# Patient Record
Sex: Female | Born: 1955
Health system: Southern US, Community
[De-identification: ages and names within clinical notes are randomized; demographics above are authoritative.]

## PROBLEM LIST (undated history)

## (undated) DIAGNOSIS — E782 Mixed hyperlipidemia: Secondary | ICD-10-CM

## (undated) DIAGNOSIS — Z780 Asymptomatic menopausal state: Secondary | ICD-10-CM

## (undated) DIAGNOSIS — M549 Dorsalgia, unspecified: Secondary | ICD-10-CM

## (undated) DIAGNOSIS — R5382 Chronic fatigue, unspecified: Secondary | ICD-10-CM

## (undated) DIAGNOSIS — G43909 Migraine, unspecified, not intractable, without status migrainosus: Secondary | ICD-10-CM

## (undated) DIAGNOSIS — G629 Polyneuropathy, unspecified: Secondary | ICD-10-CM

## (undated) DIAGNOSIS — F419 Anxiety disorder, unspecified: Secondary | ICD-10-CM

## (undated) DIAGNOSIS — M797 Fibromyalgia: Secondary | ICD-10-CM

## (undated) DIAGNOSIS — K589 Irritable bowel syndrome without diarrhea: Secondary | ICD-10-CM

## (undated) DIAGNOSIS — R51 Headache: Secondary | ICD-10-CM

## (undated) DIAGNOSIS — R569 Unspecified convulsions: Secondary | ICD-10-CM

## (undated) DIAGNOSIS — R42 Dizziness and giddiness: Secondary | ICD-10-CM

## (undated) DIAGNOSIS — I872 Venous insufficiency (chronic) (peripheral): Secondary | ICD-10-CM

## (undated) DIAGNOSIS — G4700 Insomnia, unspecified: Secondary | ICD-10-CM

## (undated) HISTORY — PX: LAPAROSCOPY: SHX197

## (undated) HISTORY — PX: OVARY SURGERY: SHX727

## (undated) HISTORY — PX: HEMORRHOID SURGERY: SHX153

## (undated) HISTORY — DX: Fibromyalgia: M79.7

## (undated) HISTORY — DX: Headache: R51

## (undated) HISTORY — DX: Migraine, unspecified, not intractable, without status migrainosus: G43.909

## (undated) HISTORY — DX: Dizziness and giddiness: R42

## (undated) HISTORY — DX: Mixed hyperlipidemia: E78.2

## (undated) HISTORY — PX: APPENDECTOMY: SHX54

## (undated) HISTORY — DX: Dorsalgia, unspecified: M54.9

## (undated) HISTORY — PX: COLONOSCOPY: SHX174

## (undated) HISTORY — PX: TOTAL ABDOMINAL HYSTERECTOMY W/ BILATERAL SALPINGOOPHORECTOMY: SHX83

## (undated) HISTORY — DX: Unspecified convulsions: R56.9

## (undated) HISTORY — DX: Asymptomatic menopausal state: Z78.0

## (undated) HISTORY — DX: Chronic fatigue, unspecified: R53.82

## (undated) HISTORY — DX: Polyneuropathy, unspecified: G62.9

## (undated) HISTORY — PX: TONSILLECTOMY: SUR1361

## (undated) HISTORY — DX: Venous insufficiency (chronic) (peripheral): I87.2

## (undated) HISTORY — DX: Insomnia, unspecified: G47.00

## (undated) HISTORY — DX: Irritable bowel syndrome, unspecified: K58.9

## (undated) HISTORY — DX: Anxiety disorder, unspecified: F41.9

---

## 1998-06-14 ENCOUNTER — Other Ambulatory Visit: Admission: RE | Admit: 1998-06-14 | Discharge: 1998-06-14 | Payer: Self-pay | Admitting: Obstetrics & Gynecology

## 1999-10-13 ENCOUNTER — Other Ambulatory Visit: Admission: RE | Admit: 1999-10-13 | Discharge: 1999-10-13 | Payer: Self-pay | Admitting: Obstetrics & Gynecology

## 2001-02-13 ENCOUNTER — Ambulatory Visit (HOSPITAL_COMMUNITY): Admission: RE | Admit: 2001-02-13 | Discharge: 2001-02-13 | Payer: Self-pay | Admitting: Gastroenterology

## 2001-11-06 ENCOUNTER — Other Ambulatory Visit: Admission: RE | Admit: 2001-11-06 | Discharge: 2001-11-06 | Payer: Self-pay | Admitting: Obstetrics & Gynecology

## 2001-11-22 ENCOUNTER — Encounter: Payer: Self-pay | Admitting: Emergency Medicine

## 2001-11-22 ENCOUNTER — Inpatient Hospital Stay (HOSPITAL_COMMUNITY): Admission: EM | Admit: 2001-11-22 | Discharge: 2001-11-26 | Payer: Self-pay | Admitting: Emergency Medicine

## 2001-11-24 ENCOUNTER — Encounter: Payer: Self-pay | Admitting: Internal Medicine

## 2002-01-10 ENCOUNTER — Emergency Department (HOSPITAL_COMMUNITY): Admission: EM | Admit: 2002-01-10 | Discharge: 2002-01-10 | Payer: Self-pay | Admitting: Emergency Medicine

## 2002-01-10 ENCOUNTER — Encounter: Payer: Self-pay | Admitting: Emergency Medicine

## 2003-03-23 ENCOUNTER — Other Ambulatory Visit: Admission: RE | Admit: 2003-03-23 | Discharge: 2003-03-23 | Payer: Self-pay | Admitting: Obstetrics & Gynecology

## 2005-07-19 ENCOUNTER — Other Ambulatory Visit: Admission: RE | Admit: 2005-07-19 | Discharge: 2005-07-19 | Payer: Self-pay | Admitting: Obstetrics & Gynecology

## 2008-07-22 ENCOUNTER — Ambulatory Visit (HOSPITAL_COMMUNITY): Admission: RE | Admit: 2008-07-22 | Discharge: 2008-07-23 | Payer: Self-pay | Admitting: General Surgery

## 2008-07-22 ENCOUNTER — Encounter (HOSPITAL_BASED_OUTPATIENT_CLINIC_OR_DEPARTMENT_OTHER): Payer: Self-pay | Admitting: General Surgery

## 2010-12-27 NOTE — Op Note (Signed)
[name], [name]               ACCOUNT NO.:  [id number]   MEDICAL RECORD NO.:  [id number]          PATIENT TYPE:  AMB   LOCATION:  DAY                          FACILITY:  [facility]   PHYSICIAN:  Leonie Man, M.D.   DATE OF BIRTH:  05-29-1956   DATE OF PROCEDURE:  07/22/2008  DATE OF DISCHARGE:                               OPERATIVE REPORT   PREOPERATIVE DIAGNOSIS:  Grade 4 hemorrhoidal disease.   POSTOPERATIVE DIAGNOSIS:  Grade 4 hemorrhoidal disease.   PROCEDURE:  Full column hemorrhoidectomy.   SURGEON:  Leonie Man, M.D.   ASSISTANT:  OR tech.   ANESTHESIA:  General.   Ashlee Hooper is a 55 year old female with a longstanding  hemorrhoidal disease and with multiple medical problems, including  migraine headache and fibromyalgia.  She has been having significant  hemorrhoidal prolapse over the past several years and comes now for  hemorrhoidectomy because the hemorrhoids and prolapse make it difficult  for her to walk.  The patient is aware of the risks and the potential  benefits of surgery including the risks of pain, anal stricture and  sphincter damage.  She understands these risks and consents to surgery.   PROCEDURE:  Following the induction of satisfactory general endotracheal  anesthesia, the patient positioned prone jackknife position, buttock  cheeks are pulled apart with tapes and perianal tissues are prepped and  draped to be included in the sterile operative field.  The patient is  identified as Ashlee Hooper, procedure to be done hemorrhoidectomy.  All  surgical precautions were taken.  The patient is noted to be allergic to  penicillin, sulfa and codeine.  This was a difficult intubation.  However, the airway is well-secured at the time of surgery.  I then  infiltrated the perianal tissues with 1% lidocaine with epinephrine and  Wydase and then dilated the anal sphincters up to approximately three  fingerbreadths.  I inserted the operating scope  and visual exploration  showed two major areas of hemorrhoidal disease located at 2 o'clock and  8 o'clock positions with the patient in the prone position.  The  hemorrhoid at the 8 o'clock position was grasped and infiltrated with 1%  lidocaine with epinephrine.  An elliptical incision was then carried  from just below the dentate line over the surface of the hemorrhoid and  onto the perianal skin.  The hemorrhoid was then dissected free from off  the underlying external sphincter muscle, carrying the dissection up and  over the external and internal sphincter and removing the hemorrhoid in  its entirety.  The hemostasis was obtained with electrocautery and the  mucosa and mucocutaneous junction was reapproximated with a running  suture of 3-0 chromic catgut.  This area was thoroughly checked for  bleeding and noted to be dry.   Attention was then turned to the large hemorrhoidal complex at the 2  o'clock axis of the anus with the patient in the prone position.  This  was similarly injected with 1% lidocaine and another large elliptical  incision carried from below the dentate line across the mucocutaneous  junction onto the  buttock  skin.  The hemorrhoid was then dissected free  from off the external and internal sphincter muscles and carefully  carrying dissection up to remove the entire hemorrhoid.  Again  hemostasis was obtained with electrocautery.  The mucosa and  mucocutaneous junction was again closed with running suture of 3-0  chromic catgut.  Additional small hemorrhoids were then simply suture  ligated and/or cauterized.  All areas of dissection were then checked  for hemostasis, noted to be dry.  Sponge and  instrument counts were verified.  I used a Gelfoam pad soaked in  lidocaine to place over the incision lines.  A sterile dressing was  placed over the anal verge.  The anesthetic reversed and the patient was  removed from the operating room to the recovery room in  stable  condition.  She tolerated the procedure well.      Leonie Man, M.D.  Electronically Signed     PB/MEDQ  D:  07/22/2008  T:  07/22/2008  Job:  [id number]

## 2010-12-30 NOTE — Discharge Summary (Signed)
[place]. [facility]  Patient:    Ashlee Hooper, Ashlee Hooper Visit Number: [id number] MRN: [id number]          Service Type: MED Location: [phone] Attending Physician:  Alfonso Ramus Dictated by:   Hillery Aldo, M.D. Admit Date:  11/22/2001 Discharge Date: 11/26/2001   CC:         Delbert Harness, M.D., [place], [place].   Discharge Summary  PATIENT HOME ADDRESS: [address] [place], [place]. [address]  PATIENT PHONE NUMBER: [phone].  DISCHARGE DIAGNOSES: 1. Narcotics overdose. 2. Fibromyalgia. 3. Irritable bowel syndrome. 4. Migraine headaches. 5. Chronic fatigue syndrome. 6. History of obsessive compulsive disorder/depression. 7. Wilsons syndrome (reverse T3 excess). 8. Bradycardia. 9. Leukocytosis. 10.Elevated troponins.  DISCHARGE MEDICATIONS: 1. MS Contin 30 mg p.o. q.12h. 2. MSIR 15 mg p.o. q.6h. p.r.n. for breakthrough pain. 3. Estradiol transdermal 0.1 mg patch q.d. 4. Luvox as at home. 5. Imitrex as at home.  FOLLOW-UP: The patient will follow-up with her primary care physician, Dr. Biagio Quint, as previously scheduled. She will also follow-up with the pain clinic. to be arranged through Dr. Biagio Quint.  DISPOSITION: The patient is discharged home in stable condition. She is instructed to resume activity as tolerated. She is to maintain a lactose-free diet as tolerated. The patient was instructed not to take any additional narcotic pain medications or use the Fentanyl patches until she is seen by Dr. Biagio Quint.  PROCEDURES/DIAGNOSTIC STUDIES: 1. CT scan of the head without contrast on November 22, 2001. Negative for acute    process. 2. Chest x-ray on November 22, 2001. No evidence of active chest disease. 3. CT scan of the head without contrast on November 24, 2001. Negative    noncontrast cranial CT. No change from previous study. 4. A 2-D echocardiogram on November 25, 2001. Overall left ventricular systolic  function was normal. Left ventricular ejection fraction 55-65%. There were    no left ventricular regional wall motion abnormalities. Normal    echocardiographic study.  CONSULTATIONS: None.  HISTORY OF PRESENT ILLNESS: The patient is a 55 year old white female with a past medical history of fibromyalgia, irritable bowel syndrome, migraine headaches, and chronic fatigue syndrome, on multiple benzodiazepine and narcotic pain medications. She was found unresponsive by her husband approximately 2:30 pm the afternoon of her admission. The patient was recently begun on a new pain medication (Duragesic patch) the day prior and slept well through the night according to her husband. The patients husband noticed that she appeared pale and cyanotic when he found her at 2:30 pm and had difficulty waking her up so he called EMS. The patient was given 2 mg of Narcan by EMS and additional 1 mg while in the emergency department with profuse emesis afterward. According to the patients husband, the patient was instructed not to use any additional Morphine while on the Duragesic patch. Her prescription bottles were reviewed with the husband and it was noted that the patient was missing ten MSIR 15 mg tablets from the prescription that was filled the day prior, as well as two MS Contin 30 mg tablets.  The patient denied any suicide attempt or deliberately taking extra doses of pain medicine.  Review of systems was difficult to obtain secondary to the patients being drowsy. The patient did deny any recent cold, shortness of breath, cough, or chest pain except for her normal fibromyalgia type of pain. However, she did admit to a headache but has a known history of migraine headache pain. The  patient was admitted for further evaluation and workup.  ADMISSION LABORATORY DATA: White blood cell count of 21.2, hemoglobin 13.7, hematocrit 40.5, platelet count of 290. There were 90% neutrophils, 5% lymphocytes,  and 5% monocytes on the differential. Her chemistry showed a sodium of 140, potassium 4.6, chloride 105, bicarb 29, BUN 13, creatinine 1.6, glucose 77. Her acetaminophen level was 1.2, salicylate less than 5, and alcohol less than 5.  Urine pregnancy test was negative. Urine drug screen was positive for tetrahydrocannabinol, opiates, and benzodiazepines. Urinalysis revealed a specific gravity of 1.017 and was negative for glucose, hemoglobin, protein, ketones, nitrites, or leukocyte esterase.  DIAGNOSTIC STUDIES: An EKG was obtained which showed sinus tachycardia at 110 beats per minute but no ST segment changes and a normal access.  HOSPITAL COURSE:  #1 - ALTERED MENTAL STATUS: The patients presentation was felt to be consistent with opiate overdose. It became apparent that the patient had had a massage on the day prior to her presentation and might have had an excessive amount of Fentanyl released from the patch because she was lying on a heated pad.  She had improvement of mentation and arousal with Narcan. There was no other evidence of intracranial abnormality. Given the fact that the patient had an elevated white blood cell count and no obvious focus of infection, a lumbar puncture was performed. Cultures of the cerebral spinal fluid were negative times three days at the time of her discharge.  Additional findings included a protein level of 32, glucose slightly high at 89, The cell count included a WBC of 7 with 10% neutrophils, 65% lymphocytes, 25% macrophages, and 0 eosinophils. There were 82 red blood cells. VRDL was negative. AFB smear was negative.  Final culture results are pending.  The patients mentation improved throughout the course of her hospitalization and at the time of her discharge, she is alert and oriented. Given the laboratory findings, it was felt that the patients altered mental status was the result of a narcotic overdosage alone.  She was started on MS  Contin 30 mg q.12h. the day after her  admission to avoid any narcotics withdrawal problems.  #2 - ELEVATED WHITE BLOOD CELL COUNT: The patients urinalysis, blood cultures, chest x-ray, and lumbar puncture did not reveal any source of infectious etiology. At the time of her discharge, the patients white blood cell count has normalized and is 9.4.  Her elevated white blood cell count might have been secondary to demargination secondary to stress.  #3 - BRADYCARDIA: The patient developed a bradycardic rhythm on April 13,2003, prompting an EKG which revealed T wave inversion in the inferolateral leads that were new since her admission.  Her cardiac enzymes were checked times three and she had some abnormalities including a CK of 759, 570, and 444. A CKMB of 2.3, 2.2, and 1.8. And a troponin I of 0.32. 0.30, and 0.24. This prompted further cardiac evaluation with a 2-D echocardiogram which did not show any evidence of myocardial dysfunction. It is unclear as to the exact etiology of her elevated CKs and troponins, although when she presented, she was hypoxic and it is possible that her impending respiratory failure may have contributed. Additionally, further testing was undertaken to evaluate the source of her bradycardia. A TSH was found to be normal at 1.397, free T4 level was slightly low at 0.59, and T3 was normal at 62.6. She did not have any evidence of Lyme disease or other autoimmune process as her ANA was negative, anti-DNA antibodies  were negative, her rheumatoid factor was less than 20, Lyme disease titers were negative.  #4 - HYPOXIA: The patient was hypoxic on initial arrival with pulse oximetry reading of 80% out in the field. The patients O2 saturations improved dramatically with supplemental oxygen and reversal of narcotic induced respiratory depression with Narcan. The patient did not require intubation or any other Narcan throughout the course of her hospitalization. She  maintained her O2 saturations in the upper 90s.  #5 - IRRITABLE BOWEL SYNDROME: The patient did have some difficulties with moving her bowels normally while hospitalized. She had some problems with constipation and pain. She was given Sorbitol as well as laxative of choice p.r.n. The Sorbitol was effective. However, she did have some diarrhea on the night prior to her discharge. She was supplemented with some potassium prior to discharge secondary to this.  #6 - FIBROMYALGIA: The patient had fibromyalgia type point tenderness. Given her recent narcotics overdose, a low level of long acting pain medication was provided with supplemental nonsteroidal anti-inflammatories. The patient will follow-up with regard to this problem with her primary care physician and be enrolled in a pain clinic for further management. Dictated by:   Hillery Aldo, M.D. Attending Physician:  Alfonso Ramus DD:  11/26/01 TD:  11/26/01 Job: [id number]

## 2011-05-19 LAB — DIFFERENTIAL
Eosinophils Absolute: 0.1 10*3/uL (ref 0.0–0.7)
Eosinophils Relative: 1 % (ref 0–5)
Lymphocytes Relative: 17 % (ref 12–46)
Lymphs Abs: 1.6 10*3/uL (ref 0.7–4.0)
Monocytes Absolute: 0.5 10*3/uL (ref 0.1–1.0)

## 2011-05-19 LAB — COMPREHENSIVE METABOLIC PANEL
ALT: 20 U/L (ref 0–35)
AST: 22 U/L (ref 0–37)
Albumin: 3.5 g/dL (ref 3.5–5.2)
CO2: 30 mEq/L (ref 19–32)
Calcium: 9.2 mg/dL (ref 8.4–10.5)
Chloride: 101 mEq/L (ref 96–112)
Creatinine, Ser: 0.94 mg/dL (ref 0.4–1.2)
GFR calc Af Amer: >60 mL/min (ref >60)
GFR calc non Af Amer: >60 mL/min (ref >60)
Sodium: 140 mEq/L (ref 135–145)
Total Bilirubin: 0.4 mg/dL (ref 0.3–1.2)

## 2011-05-19 LAB — TYPE AND SCREEN
ABO/RH(D): A POS
Antibody Screen: NEGATIVE

## 2011-05-19 LAB — CBC
HCT: 43 % (ref 36.0–46.0)
Hemoglobin: 14.3 g/dL (ref 12.0–15.0)
MCHC: 33.3 g/dL (ref 30.0–36.0)
MCV: 98.2 fL (ref 78.0–100.0)
Platelets: 324 10*3/uL (ref 150–400)
RBC: 4.38 MIL/uL (ref 3.87–5.11)
RDW: 15.9 % — ABNORMAL HIGH (ref 11.5–15.5)
WBC: 9.2 10*3/uL (ref 4.0–10.5)

## 2011-05-19 LAB — PROTIME-INR: Prothrombin Time: 13.1 seconds (ref 11.6–15.2)

## 2011-05-19 LAB — ABO/RH: ABO/RH(D): A POS

## 2011-10-09 DIAGNOSIS — M159 Polyosteoarthritis, unspecified: Secondary | ICD-10-CM | POA: Diagnosis not present

## 2011-10-09 DIAGNOSIS — M549 Dorsalgia, unspecified: Secondary | ICD-10-CM | POA: Diagnosis not present

## 2011-10-09 DIAGNOSIS — IMO0001 Reserved for inherently not codable concepts without codable children: Secondary | ICD-10-CM | POA: Diagnosis not present

## 2011-10-09 DIAGNOSIS — E782 Mixed hyperlipidemia: Secondary | ICD-10-CM | POA: Diagnosis not present

## 2011-12-25 DIAGNOSIS — M549 Dorsalgia, unspecified: Secondary | ICD-10-CM | POA: Diagnosis not present

## 2011-12-25 DIAGNOSIS — E782 Mixed hyperlipidemia: Secondary | ICD-10-CM | POA: Diagnosis not present

## 2011-12-25 DIAGNOSIS — M159 Polyosteoarthritis, unspecified: Secondary | ICD-10-CM | POA: Diagnosis not present

## 2011-12-25 DIAGNOSIS — IMO0001 Reserved for inherently not codable concepts without codable children: Secondary | ICD-10-CM | POA: Diagnosis not present

## 2012-01-01 DIAGNOSIS — R7989 Other specified abnormal findings of blood chemistry: Secondary | ICD-10-CM | POA: Diagnosis not present

## 2012-01-01 DIAGNOSIS — H908 Mixed conductive and sensorineural hearing loss, unspecified: Secondary | ICD-10-CM | POA: Diagnosis not present

## 2012-01-01 DIAGNOSIS — M549 Dorsalgia, unspecified: Secondary | ICD-10-CM | POA: Diagnosis not present

## 2012-01-01 DIAGNOSIS — E782 Mixed hyperlipidemia: Secondary | ICD-10-CM | POA: Diagnosis not present

## 2012-03-25 DIAGNOSIS — G609 Hereditary and idiopathic neuropathy, unspecified: Secondary | ICD-10-CM | POA: Diagnosis not present

## 2012-03-25 DIAGNOSIS — E782 Mixed hyperlipidemia: Secondary | ICD-10-CM | POA: Diagnosis not present

## 2012-03-25 DIAGNOSIS — M549 Dorsalgia, unspecified: Secondary | ICD-10-CM | POA: Diagnosis not present

## 2012-03-25 DIAGNOSIS — M159 Polyosteoarthritis, unspecified: Secondary | ICD-10-CM | POA: Diagnosis not present

## 2012-04-01 DIAGNOSIS — IMO0001 Reserved for inherently not codable concepts without codable children: Secondary | ICD-10-CM | POA: Diagnosis not present

## 2012-04-01 DIAGNOSIS — E782 Mixed hyperlipidemia: Secondary | ICD-10-CM | POA: Diagnosis not present

## 2012-04-01 DIAGNOSIS — R7989 Other specified abnormal findings of blood chemistry: Secondary | ICD-10-CM | POA: Diagnosis not present

## 2012-04-01 DIAGNOSIS — M549 Dorsalgia, unspecified: Secondary | ICD-10-CM | POA: Diagnosis not present

## 2012-07-02 DIAGNOSIS — G609 Hereditary and idiopathic neuropathy, unspecified: Secondary | ICD-10-CM | POA: Diagnosis not present

## 2012-07-02 DIAGNOSIS — E782 Mixed hyperlipidemia: Secondary | ICD-10-CM | POA: Diagnosis not present

## 2012-07-02 DIAGNOSIS — M549 Dorsalgia, unspecified: Secondary | ICD-10-CM | POA: Diagnosis not present

## 2012-07-02 DIAGNOSIS — IMO0001 Reserved for inherently not codable concepts without codable children: Secondary | ICD-10-CM | POA: Diagnosis not present

## 2012-08-26 DIAGNOSIS — Z1231 Encounter for screening mammogram for malignant neoplasm of breast: Secondary | ICD-10-CM | POA: Diagnosis not present

## 2012-08-26 DIAGNOSIS — Z124 Encounter for screening for malignant neoplasm of cervix: Secondary | ICD-10-CM | POA: Diagnosis not present

## 2012-08-26 DIAGNOSIS — Z1212 Encounter for screening for malignant neoplasm of rectum: Secondary | ICD-10-CM | POA: Diagnosis not present

## 2012-08-30 ENCOUNTER — Other Ambulatory Visit: Payer: Self-pay | Admitting: Obstetrics & Gynecology

## 2012-08-30 DIAGNOSIS — R928 Other abnormal and inconclusive findings on diagnostic imaging of breast: Secondary | ICD-10-CM

## 2012-09-24 ENCOUNTER — Ambulatory Visit
Admission: RE | Admit: 2012-09-24 | Discharge: 2012-09-24 | Disposition: A | Discharge status: Home / Self Care | Payer: Medicare Other | Source: Ambulatory Visit | Attending: Obstetrics & Gynecology | Admitting: Obstetrics & Gynecology

## 2012-09-24 DIAGNOSIS — R928 Other abnormal and inconclusive findings on diagnostic imaging of breast: Secondary | ICD-10-CM

## 2012-10-02 DIAGNOSIS — E782 Mixed hyperlipidemia: Secondary | ICD-10-CM | POA: Diagnosis not present

## 2012-10-02 DIAGNOSIS — G609 Hereditary and idiopathic neuropathy, unspecified: Secondary | ICD-10-CM | POA: Diagnosis not present

## 2013-01-01 DIAGNOSIS — Z1331 Encounter for screening for depression: Secondary | ICD-10-CM | POA: Diagnosis not present

## 2013-01-01 DIAGNOSIS — M549 Dorsalgia, unspecified: Secondary | ICD-10-CM | POA: Diagnosis not present

## 2013-01-01 DIAGNOSIS — E782 Mixed hyperlipidemia: Secondary | ICD-10-CM | POA: Diagnosis not present

## 2013-01-01 DIAGNOSIS — G609 Hereditary and idiopathic neuropathy, unspecified: Secondary | ICD-10-CM | POA: Diagnosis not present

## 2013-01-01 DIAGNOSIS — Z0000 Encounter for general adult medical examination without abnormal findings: Secondary | ICD-10-CM | POA: Diagnosis not present

## 2013-01-08 DIAGNOSIS — H908 Mixed conductive and sensorineural hearing loss, unspecified: Secondary | ICD-10-CM | POA: Diagnosis not present

## 2013-01-08 DIAGNOSIS — M549 Dorsalgia, unspecified: Secondary | ICD-10-CM | POA: Diagnosis not present

## 2013-01-08 DIAGNOSIS — IMO0001 Reserved for inherently not codable concepts without codable children: Secondary | ICD-10-CM | POA: Diagnosis not present

## 2013-01-08 DIAGNOSIS — E782 Mixed hyperlipidemia: Secondary | ICD-10-CM | POA: Diagnosis not present

## 2013-01-08 DIAGNOSIS — M159 Polyosteoarthritis, unspecified: Secondary | ICD-10-CM | POA: Diagnosis not present

## 2013-03-18 DIAGNOSIS — F332 Major depressive disorder, recurrent severe without psychotic features: Secondary | ICD-10-CM | POA: Diagnosis not present

## 2013-03-28 DIAGNOSIS — F332 Major depressive disorder, recurrent severe without psychotic features: Secondary | ICD-10-CM | POA: Diagnosis not present

## 2013-04-04 DIAGNOSIS — F332 Major depressive disorder, recurrent severe without psychotic features: Secondary | ICD-10-CM | POA: Diagnosis not present

## 2013-04-08 DIAGNOSIS — F332 Major depressive disorder, recurrent severe without psychotic features: Secondary | ICD-10-CM | POA: Diagnosis not present

## 2013-04-10 DIAGNOSIS — M159 Polyosteoarthritis, unspecified: Secondary | ICD-10-CM | POA: Diagnosis not present

## 2013-04-10 DIAGNOSIS — M549 Dorsalgia, unspecified: Secondary | ICD-10-CM | POA: Diagnosis not present

## 2013-04-10 DIAGNOSIS — E782 Mixed hyperlipidemia: Secondary | ICD-10-CM | POA: Diagnosis not present

## 2013-06-04 DIAGNOSIS — F330 Major depressive disorder, recurrent, mild: Secondary | ICD-10-CM | POA: Diagnosis not present

## 2013-07-09 DIAGNOSIS — G43909 Migraine, unspecified, not intractable, without status migrainosus: Secondary | ICD-10-CM | POA: Diagnosis not present

## 2013-07-09 DIAGNOSIS — M549 Dorsalgia, unspecified: Secondary | ICD-10-CM | POA: Diagnosis not present

## 2013-07-09 DIAGNOSIS — IMO0001 Reserved for inherently not codable concepts without codable children: Secondary | ICD-10-CM | POA: Diagnosis not present

## 2013-07-09 DIAGNOSIS — M159 Polyosteoarthritis, unspecified: Secondary | ICD-10-CM | POA: Diagnosis not present

## 2013-10-16 DIAGNOSIS — E782 Mixed hyperlipidemia: Secondary | ICD-10-CM | POA: Diagnosis not present

## 2013-10-16 DIAGNOSIS — M159 Polyosteoarthritis, unspecified: Secondary | ICD-10-CM | POA: Diagnosis not present

## 2013-10-16 DIAGNOSIS — M549 Dorsalgia, unspecified: Secondary | ICD-10-CM | POA: Diagnosis not present

## 2013-10-28 DIAGNOSIS — F330 Major depressive disorder, recurrent, mild: Secondary | ICD-10-CM | POA: Diagnosis not present

## 2013-11-18 DIAGNOSIS — Z01419 Encounter for gynecological examination (general) (routine) without abnormal findings: Secondary | ICD-10-CM | POA: Diagnosis not present

## 2013-12-03 DIAGNOSIS — H25019 Cortical age-related cataract, unspecified eye: Secondary | ICD-10-CM | POA: Diagnosis not present

## 2013-12-03 DIAGNOSIS — H5210 Myopia, unspecified eye: Secondary | ICD-10-CM | POA: Diagnosis not present

## 2013-12-03 DIAGNOSIS — H524 Presbyopia: Secondary | ICD-10-CM | POA: Diagnosis not present

## 2014-01-22 DIAGNOSIS — E782 Mixed hyperlipidemia: Secondary | ICD-10-CM | POA: Diagnosis not present

## 2014-01-22 DIAGNOSIS — R7989 Other specified abnormal findings of blood chemistry: Secondary | ICD-10-CM | POA: Diagnosis not present

## 2014-01-22 DIAGNOSIS — Z0000 Encounter for general adult medical examination without abnormal findings: Secondary | ICD-10-CM | POA: Diagnosis not present

## 2014-01-22 DIAGNOSIS — IMO0001 Reserved for inherently not codable concepts without codable children: Secondary | ICD-10-CM | POA: Diagnosis not present

## 2014-01-22 DIAGNOSIS — Z1331 Encounter for screening for depression: Secondary | ICD-10-CM | POA: Diagnosis not present

## 2014-01-29 DIAGNOSIS — IMO0001 Reserved for inherently not codable concepts without codable children: Secondary | ICD-10-CM | POA: Diagnosis not present

## 2014-01-29 DIAGNOSIS — M549 Dorsalgia, unspecified: Secondary | ICD-10-CM | POA: Diagnosis not present

## 2014-01-29 DIAGNOSIS — M159 Polyosteoarthritis, unspecified: Secondary | ICD-10-CM | POA: Diagnosis not present

## 2014-01-29 DIAGNOSIS — E782 Mixed hyperlipidemia: Secondary | ICD-10-CM | POA: Diagnosis not present

## 2014-03-05 DIAGNOSIS — R42 Dizziness and giddiness: Secondary | ICD-10-CM | POA: Diagnosis not present

## 2014-03-25 DIAGNOSIS — R42 Dizziness and giddiness: Secondary | ICD-10-CM | POA: Diagnosis not present

## 2014-03-25 DIAGNOSIS — R55 Syncope and collapse: Secondary | ICD-10-CM | POA: Diagnosis not present

## 2014-03-30 ENCOUNTER — Other Ambulatory Visit (HOSPITAL_COMMUNITY): Payer: Self-pay | Admitting: Internal Medicine

## 2014-03-30 DIAGNOSIS — R55 Syncope and collapse: Secondary | ICD-10-CM

## 2014-04-02 ENCOUNTER — Ambulatory Visit (HOSPITAL_COMMUNITY)
Admission: RE | Admit: 2014-04-02 | Discharge: 2014-04-02 | Disposition: A | Discharge status: Home / Self Care | Payer: Medicare Other | Source: Ambulatory Visit | Attending: Cardiology | Admitting: Cardiology

## 2014-04-02 ENCOUNTER — Ambulatory Visit (HOSPITAL_COMMUNITY)
Admission: RE | Admit: 2014-04-02 | Discharge: 2014-04-02 | Disposition: A | Discharge status: Home / Self Care | Payer: Medicare Other | Source: Ambulatory Visit | Attending: Internal Medicine | Admitting: Internal Medicine

## 2014-04-02 DIAGNOSIS — R55 Syncope and collapse: Secondary | ICD-10-CM

## 2014-04-02 NOTE — Progress Notes (Signed)
2D Echo Performed 04/02/2014    Marygrace Drought, RCS

## 2014-04-02 NOTE — Progress Notes (Signed)
Carotid Duplex Completed. Mackinzie Vuncannon, BS, RDMS, RVT  

## 2014-04-13 DIAGNOSIS — IMO0001 Reserved for inherently not codable concepts without codable children: Secondary | ICD-10-CM | POA: Diagnosis not present

## 2014-04-13 DIAGNOSIS — R42 Dizziness and giddiness: Secondary | ICD-10-CM | POA: Diagnosis not present

## 2014-04-13 DIAGNOSIS — M159 Polyosteoarthritis, unspecified: Secondary | ICD-10-CM | POA: Diagnosis not present

## 2014-04-21 DIAGNOSIS — F331 Major depressive disorder, recurrent, moderate: Secondary | ICD-10-CM | POA: Diagnosis not present

## 2014-04-27 ENCOUNTER — Encounter: Payer: Self-pay | Admitting: Neurology

## 2014-04-28 ENCOUNTER — Encounter: Payer: Self-pay | Admitting: Neurology

## 2014-04-28 ENCOUNTER — Encounter (INDEPENDENT_AMBULATORY_CARE_PROVIDER_SITE_OTHER): Payer: Self-pay

## 2014-04-28 ENCOUNTER — Ambulatory Visit (INDEPENDENT_AMBULATORY_CARE_PROVIDER_SITE_OTHER): Payer: Medicare Other | Admitting: Neurology

## 2014-04-28 VITALS — BP 110/70 | HR 74 | Ht 64.0 in | Wt 150.0 lb

## 2014-04-28 DIAGNOSIS — R519 Headache, unspecified: Secondary | ICD-10-CM | POA: Insufficient documentation

## 2014-04-28 DIAGNOSIS — R42 Dizziness and giddiness: Secondary | ICD-10-CM

## 2014-04-28 DIAGNOSIS — R51 Headache: Secondary | ICD-10-CM | POA: Diagnosis not present

## 2014-04-28 HISTORY — DX: Headache: R51

## 2014-04-28 HISTORY — DX: Dizziness and giddiness: R42

## 2014-04-28 MED ORDER — TIZANIDINE HCL 2 MG PO TABS: 2.0000 mg | ORAL_TABLET | Freq: Three times a day (TID) | ORAL | Status: DC

## 2014-04-28 NOTE — Progress Notes (Signed)
Reason for visit: Dizziness   Ashlee Hooper is a 58 y.o. female  History of present illness:  Ashlee Hooper is a 58 year old right-handed white female with a history of underlying depression, fibromyalgia, and chronic fatigue syndrome. The patient reports a history of migraine headache as well. She indicates that at the end of December 2014, she began to have some onset of dizziness associated with true spinning feeling, but as time has gone on, the patient reports a more rocking sensation. The patient also has a pressure sensation in the head, and her symptoms are better in the morning, worse as the day goes on. The patient reports some occasional neck stiffness and discomfort. The patient has transient episodes of numbness that affect the lips, roof of the mouth, and across the forehead and nose. This may come and go. The patient may have some nausea, occasional vomiting, and she will have some blurring of vision. The patient does report some gait instability, no falls, and no difficulty controlling the bowels or the bladder. The patient has been placed on Pristiq, but she was placed on the medication in September 2014, well before her symptoms began. She does have occasional episodes of syncope, and she indicates that this has been a problem for her throughout her life. She reports that she did have episodes of vertigo when she was in her 30s and 20s for brief periods of time, but never for several months in a row. The patient is sent to this office for an evaluation. She indicates that she went through vestibular rehabilitation, and she gained no benefit from this. She is not sleeping well at night even with 60 mg of Restoril at night.  Past Medical History  Diagnosis Date  . Mixed hyperlipidemia   . Fibromyalgia   . Peripheral neuropathy   . Migraine   . Backache, unspecified   . Venous insufficiency   . Insomnia   . Menopause   . Vertigo   . Chronic fatigue   . Dizziness and  giddiness 04/28/2014  . Headache(784.0) 04/28/2014    Past Surgical History  Procedure Laterality Date  . Tonsillectomy    . Laparoscopy    . Colonoscopy    . Total abdominal hysterectomy w/ bilateral salpingoophorectomy    . Hemorrhoid surgery      Family History  Problem Relation Age of Onset  . Cirrhosis Father   . Alcohol abuse Father   . Fibromyalgia Sister     Social history:  reports that she has quit smoking. Her smoking use included Cigarettes. She has a 30 pack-year smoking history. She has never used smokeless tobacco. She reports that she does not drink alcohol or use illicit drugs.  Medications:  Current Outpatient Prescriptions on File Prior to Visit  Medication Sig Dispense Refill  . Cholecalciferol (VITAMIN D3) 2000 UNITS TABS Take 1 tablet by mouth daily.      Marland Kitchen estradiol (VIVELLE-DOT) 0.1 MG/24HR patch Place 1 patch onto the skin 2 (two) times a week.      . Omega-3 Fatty Acids (FISH OIL) 1000 MG CAPS Take 2 capsules by mouth daily.      . SUMAtriptan (IMITREX) 20 MG/ACT nasal spray Place 20 mg into the nose every 2 (two) hours as needed for migraine or headache. May repeat in 2 hours if headache persists or recurs.      . triamterene-hydrochlorothiazide (MAXZIDE-25) 37.5-25 MG per tablet Take 1 tablet by mouth daily.      . valACYclovir (  VALTREX) 1000 MG tablet Take 1,000 mg by mouth 2 (two) times daily.      . vitamin C (ASCORBIC ACID) 500 MG tablet Take 500 mg by mouth daily.      . vitamin E 400 UNIT capsule Take 400 Units by mouth daily.       No current facility-administered medications on file prior to visit.      Allergies  Allergen Reactions  . Bactrim [Sulfamethoxazole-Tmp Ds]   . Codeine Sulfate   . Penicillins   . Wellbutrin [Bupropion] Other (See Comments)  . Zoloft [Sertraline Hcl] Other (See Comments)    ROS:  Out of a complete 14 system review of symptoms, the patient complains only of the following symptoms, and all other reviewed  systems are negative.  Fatigue Vertigo Blurred vision Jaw pain, achy muscles Skin sensitivity Confusion, headache, numbness, dizziness, syncope Depression, anxiety, insomnia, decreased energy Sleepiness  Blood pressure 110/70, pulse 74, height 5\' 4"  (1.626 m), weight 150 lb (68.04 kg).  Physical Exam  General: The patient is alert and cooperative at the time of the examination. The patient is somewhat anxious, weepy at the time of examination.  Eyes: Pupils are equal, round, and reactive to light. Discs are flat bilaterally.  Ears: Tympanic membrane on the left is clear, there is some cerumen in the right ear canal, but this does not appear to be a wax plug.  Neck: The neck is supple, no carotid bruits are noted.  Respiratory: The respiratory examination is clear.  Cardiovascular: The cardiovascular examination reveals a regular rate and rhythm, no obvious murmurs or rubs are noted.  Neuromuscular: The patient has fair range of motion of the cervical spine.  Skin: Extremities are without significant edema.  Neurologic Exam  Mental status: The patient is alert and oriented x 3 at the time of the examination. The patient has apparent normal recent and remote memory, with an apparently normal attention span and concentration ability.  Cranial nerves: Facial symmetry is present. There is good sensation of the face to pinprick and soft touch bilaterally. The strength of the facial muscles and the muscles to head turning and shoulder shrug are normal bilaterally. Speech is well enunciated, no aphasia or dysarthria is noted. Extraocular movements are full. Visual fields are full. The tongue is midline, and the patient has symmetric elevation of the soft palate. No obvious hearing deficits are noted.  Motor: The motor testing reveals 5 over 5 strength of all 4 extremities. Good symmetric motor tone is noted throughout.  Sensory: Sensory testing is intact to pinprick, soft touch,  vibration sensation, and position sense on all 4 extremities, except that there is some decrease in position sense of both feet. No evidence of extinction is noted.  Coordination: Cerebellar testing reveals good finger-nose-finger and heel-to-shin bilaterally.  Gait and station: Gait is slightly unsteady. Tandem gait is slightly unsteady. Romberg is negative. No drift is seen.  Reflexes: Deep tendon reflexes are symmetric and normal bilaterally. Toes are downgoing bilaterally.   Assessment/Plan:  1. Dizziness, pressure sensation  2. Anxiety and depression  The patient has a long-standing issue with a rocking sensation, pressure sensation in the head, and transient numbness. This likely represents a form of a muscle tension headache. The patient will be set up for MRI evaluation of the brain. She will be placed on tizanidine taking 2 mg 3 times daily. Previously, she has been on medication such as gabapentin, Lyrica, and Cymbalta, but she did not tolerate the medications well.  She will followup in 3 months.  Jill Alexanders MD 04/28/2014 8:13 PM  [facility] [address] [place], [place] [address]  Phone [phone] Fax [phone]

## 2014-04-28 NOTE — Patient Instructions (Signed)

## 2014-05-05 ENCOUNTER — Ambulatory Visit
Admission: RE | Admit: 2014-05-05 | Discharge: 2014-05-05 | Disposition: A | Discharge status: Home / Self Care | Payer: Medicare Other | Source: Ambulatory Visit | Attending: Neurology | Admitting: Neurology

## 2014-05-05 DIAGNOSIS — R51 Headache: Secondary | ICD-10-CM | POA: Diagnosis not present

## 2014-05-05 DIAGNOSIS — R42 Dizziness and giddiness: Secondary | ICD-10-CM | POA: Diagnosis not present

## 2014-05-06 ENCOUNTER — Telehealth: Payer: Self-pay | Admitting: Neurology

## 2014-05-06 DIAGNOSIS — R51 Headache: Secondary | ICD-10-CM

## 2014-05-06 DIAGNOSIS — R42 Dizziness and giddiness: Secondary | ICD-10-CM

## 2014-05-06 MED ORDER — TIZANIDINE HCL 4 MG PO TABS: 4.0000 mg | ORAL_TABLET | Freq: Four times a day (QID) | ORAL | Status: DC | PRN

## 2014-05-06 NOTE — Telephone Encounter (Signed)
  I called patient. The MRI the brain shows nonspecific hazy changes in the white matter, not clear what the etiology is, does not appear to be consistent with multiple sclerosis or cerebrovascular disease. I'll check further blood work, the patient will go up on the tizanidine taking 4 mg 3 times daily. She indicates that she was on diazepam previously, this caused too much depression.   MRI brain results May 06 2014:  IMPRESSION: Equivocal MRI brain (without) demonstrating: 1. Mild-moderate periventricular, hazy gliosis. These findings are  non-specific and considerations include autoimmune, inflammatory,  post-infectious or microvascular ischemic etiologies. 2. No acute findings.

## 2014-05-07 ENCOUNTER — Other Ambulatory Visit (INDEPENDENT_AMBULATORY_CARE_PROVIDER_SITE_OTHER): Payer: Self-pay

## 2014-05-07 DIAGNOSIS — R269 Unspecified abnormalities of gait and mobility: Secondary | ICD-10-CM | POA: Diagnosis not present

## 2014-05-07 DIAGNOSIS — Z0289 Encounter for other administrative examinations: Secondary | ICD-10-CM

## 2014-05-07 DIAGNOSIS — G609 Hereditary and idiopathic neuropathy, unspecified: Secondary | ICD-10-CM | POA: Diagnosis not present

## 2014-05-07 DIAGNOSIS — IMO0001 Reserved for inherently not codable concepts without codable children: Secondary | ICD-10-CM | POA: Diagnosis not present

## 2014-05-07 DIAGNOSIS — R5381 Other malaise: Secondary | ICD-10-CM | POA: Diagnosis not present

## 2014-05-07 DIAGNOSIS — E538 Deficiency of other specified B group vitamins: Secondary | ICD-10-CM | POA: Diagnosis not present

## 2014-05-07 DIAGNOSIS — R930 Abnormal findings on diagnostic imaging of skull and head, not elsewhere classified: Secondary | ICD-10-CM | POA: Diagnosis not present

## 2014-05-07 DIAGNOSIS — R42 Dizziness and giddiness: Secondary | ICD-10-CM

## 2014-05-07 DIAGNOSIS — R51 Headache: Secondary | ICD-10-CM | POA: Diagnosis not present

## 2014-05-07 DIAGNOSIS — M159 Polyosteoarthritis, unspecified: Secondary | ICD-10-CM | POA: Diagnosis not present

## 2014-05-07 DIAGNOSIS — R5383 Other fatigue: Secondary | ICD-10-CM | POA: Diagnosis not present

## 2014-05-09 LAB — RHEUMATOID FACTOR: RHEUMATOID FACTOR: 17.3 [IU]/mL — AB (ref 0.0–13.9)

## 2014-05-09 LAB — HIV ANTIBODY (ROUTINE TESTING W REFLEX): HIV-1/HIV-2 Ab: NONREACTIVE

## 2014-05-09 LAB — ANA W/REFLEX: Anti Nuclear Antibody(ANA): NEGATIVE

## 2014-05-09 LAB — ANTINEUTROPHIL CYTOPLASMIC AB
Atypical pANCA: <1:20 {titer}
C-ANCA: <1:20 {titer}
P-ANCA: <1:20 {titer}

## 2014-05-09 LAB — COPPER, SERUM: Copper: 144 ug/dL (ref 72–166)

## 2014-05-09 LAB — LYME, TOTAL AB TEST/REFLEX: Lyme IgG/IgM Ab: <0.91 {ISR} (ref 0.00–0.90)

## 2014-05-09 LAB — VITAMIN B12: Vitamin B-12: 1709 pg/mL — ABNORMAL HIGH (ref 211–946)

## 2014-05-09 LAB — ANTIMYELOPEROXIDASE (MPO) ABS

## 2014-05-09 LAB — SEDIMENTATION RATE: SED RATE: 18 mm/h (ref 0–40)

## 2014-05-09 LAB — ANGIOTENSIN CONVERTING ENZYME: Angio Convert Enzyme: 43 U/L (ref 14–82)

## 2014-05-11 ENCOUNTER — Telehealth: Payer: Self-pay | Admitting: Neurology

## 2014-05-11 DIAGNOSIS — R42 Dizziness and giddiness: Secondary | ICD-10-CM

## 2014-05-11 DIAGNOSIS — R9089 Other abnormal findings on diagnostic imaging of central nervous system: Secondary | ICD-10-CM

## 2014-05-11 NOTE — Telephone Encounter (Signed)
I called patient. The blood work is unremarkable with exception that there is a minimal elevation in the rheumatoid factor, this is likely not clinically significant. The MRI the brain was abnormal, a diffuse change in the white matter was seen, not typical for demyelinating  disease. We will consider a lumbar puncture at this time. I'll get this set up.

## 2014-05-14 DIAGNOSIS — R42 Dizziness and giddiness: Secondary | ICD-10-CM | POA: Diagnosis not present

## 2014-05-26 ENCOUNTER — Telehealth: Payer: Self-pay | Admitting: Neurology

## 2014-05-26 NOTE — Telephone Encounter (Signed)
Spoke to patient and she relayed that she met with the pharmacist and she took her off her statin medication and the patient has not noticed any change in the way she feels.  She wanted to know if the doctor wanted to consider doing the spinal tap.  She can be reached at [phone]

## 2014-05-26 NOTE — Telephone Encounter (Signed)
I called the patient. She is amenable to the LP. I will get this set up.

## 2014-05-27 NOTE — Telephone Encounter (Signed)
Patient is scheduled on 06-04-14 at 12noon.  Patient aware.

## 2014-06-04 ENCOUNTER — Ambulatory Visit (INDEPENDENT_AMBULATORY_CARE_PROVIDER_SITE_OTHER): Payer: Medicare Other | Admitting: Neurology

## 2014-06-04 DIAGNOSIS — R51 Headache: Secondary | ICD-10-CM | POA: Diagnosis not present

## 2014-06-04 DIAGNOSIS — R930 Abnormal findings on diagnostic imaging of skull and head, not elsewhere classified: Secondary | ICD-10-CM

## 2014-06-04 DIAGNOSIS — G441 Vascular headache, not elsewhere classified: Secondary | ICD-10-CM | POA: Diagnosis not present

## 2014-06-04 DIAGNOSIS — R42 Dizziness and giddiness: Secondary | ICD-10-CM | POA: Diagnosis not present

## 2014-06-04 DIAGNOSIS — R9089 Other abnormal findings on diagnostic imaging of central nervous system: Secondary | ICD-10-CM

## 2014-06-04 NOTE — Procedures (Signed)
    Lumbar puncture procedure note  History:  Ashlee Hooper is a 58 year old patient with a history of white matter abnormalities on MRI of the brain, and a history of headache, numbness on the face, and a sensation of rocking. The patient is being evaluated for possible demyelinating disease.  The patient was placed in the fetal position on the right side, and the low back was cleaned with Betadine solution. Approximately 2 cc of 1% Xylocaine was used as a local anesthetic. A 20-gauge spinal needle was inserted into the L3-4 interspace, and approximately 18 cc of clear colorless spinal fluid was removed for testing. Opening pressure was 175 mm of water.  Tube #1 was sent for VDRL, cryptococcal antigen, angiotensin-converting enzyme level.  Tube #2 was sent for oligoclonal banding, IgG albumin ratio.  Tube #3 was sent for cells, differential, glucose, and protein.  Tube #4 was sent for Lyme antibody panel.  The patient tolerated the procedure well. No complications of the above procedure were noted.   Blood work was sent for factor V Leiden mutation, lupus anticoagulant antibody, Cardiolipin antibody panel.

## 2014-06-05 LAB — CELL COUNT, CSF
NUC CELL # CSF: 2 {cells}/uL (ref 0–5)
RBC COUNT CSF: 0 /uL

## 2014-06-05 LAB — GLUCOSE, CSF: Glucose, CSF: 69 mg/dL (ref 40–70)

## 2014-06-05 LAB — PROTEIN, CSF: TOTAL PROTEIN, CSF: 24.6 mg/dL (ref 0.0–44.0)

## 2014-06-08 ENCOUNTER — Telehealth: Payer: Self-pay | Admitting: Neurology

## 2014-06-08 MED ORDER — BUTALBITAL-APAP-CAFFEINE 50-325-40 MG PO TABS: 1.0000 | ORAL_TABLET | Freq: Three times a day (TID) | ORAL | Status: DC | PRN

## 2014-06-08 NOTE — Telephone Encounter (Signed)
Patient calling to state that she is still having headaches after the LP that she had last week, please return call and advise.

## 2014-06-08 NOTE — Telephone Encounter (Signed)
Patient had further questions so WID has called patient.

## 2014-06-08 NOTE — Telephone Encounter (Signed)
WID  Spoke to patient and she relayed she has had a headache since Friday, her LP was done on Thursday in the office.  She is laying down and her pain level is a 10.  Please advise on what to do.

## 2014-06-08 NOTE — Telephone Encounter (Signed)
Encourage fluids, caffeine and may use fioricet prn. -VRP

## 2014-06-08 NOTE — Addendum Note (Signed)
Addended byAndrey Spearman on: 06/08/2014 04:16 PM   Modules accepted: Orders

## 2014-06-09 LAB — LYME, WESTERN BLOT, CSF
Lyme IgG WB Interp.: NEGATIVE
Lyme IgM WB Interp.: NEGATIVE
P18 AB.: ABSENT
P23 AB. IGM: ABSENT
P23 AB.: ABSENT
P28 Ab.: ABSENT
P30 AB.: ABSENT
P39 AB. IGM: ABSENT
P39 Ab.: ABSENT
P41 AB.: ABSENT
P41 Ab. IgM: ABSENT
P45 Ab.: ABSENT
P58 Ab.: ABSENT
P66 AB.: ABSENT
P93 Ab.: ABSENT

## 2014-06-09 LAB — IGG CSF INDEX
ALBUMIN CSF-MCNC: 17 mg/dL (ref 11–48)
ALBUMIN: 4.1 g/dL (ref 3.5–5.5)
IGG CSF: 2.1 mg/dL (ref 0.0–8.6)
IGG/ALBUMIN RATIO, CSF: 0.12 (ref 0.00–0.25)
IgG (Immunoglobin G), Serum: 1061 mg/dL (ref 700–1600)
IgG Index, CSF: 0.5 (ref 0.0–0.7)

## 2014-06-09 LAB — LUPUS ANTICOAGULANT
DPT CONFIRM RATIO: 0.82 ratio (ref 0.00–1.20)
DPT: 36.7 s (ref 0.0–55.0)
Dilute Viper Venom Time: 32.2 s (ref 0.0–55.1)
PTT LA: 33.8 s (ref 0.0–50.0)
Thrombin Time: 15.9 s (ref 0.0–20.0)

## 2014-06-09 LAB — OLIGOCLONAL BANDING

## 2014-06-09 LAB — FACTOR 5 LEIDEN

## 2014-06-09 LAB — CARDIOLIPIN ANTIBODY: Anticardiolipin IgA: <9 APL U/mL (ref 0–11)

## 2014-06-09 NOTE — Telephone Encounter (Signed)
I called the patient. The patient is reporting spinal headaches that are worse with sitting and standing. She was given Fioricet, I indicated that a blood patch may be needed. She will call me if the headaches continue.

## 2014-06-09 NOTE — Telephone Encounter (Signed)
Patient has further questions and wants to discuss them with Dr. Jannifer Franklin, please return call and advise.

## 2014-06-10 LAB — VDRL, CSF: SYPHILIS VDRL QUANT CSF: NONREACTIVE

## 2014-06-10 LAB — CRYPTOCOCCUS ANTIGEN, CSF: Cryptococcus Antigen, CSF: NEGATIVE

## 2014-06-10 LAB — ANGIOTENSIN CONVERTING ENZYME, CSF: Angio Convert Enzyme, CSF: 1.3 U/L (ref 0.0–2.5)

## 2014-06-22 ENCOUNTER — Telehealth: Payer: Self-pay | Admitting: Neurology

## 2014-06-22 DIAGNOSIS — H8110 Benign paroxysmal vertigo, unspecified ear: Secondary | ICD-10-CM

## 2014-06-22 NOTE — Telephone Encounter (Signed)
Patient calling to request her LP on 10/22 results, please return call and advise.

## 2014-06-22 NOTE — Telephone Encounter (Signed)
I called the patient. Lumbar puncture and blood work were completely normal. The patient continues to have vertigo and spinning and rocking sensations, I will refer to vestibular rehabilitation. The patient has a lot of fibromyalgia symptoms, she is unable to tolerate all medications used for this issue.

## 2014-07-24 ENCOUNTER — Ambulatory Visit: Payer: Medicare Other | Attending: Neurology | Admitting: Rehabilitative and Restorative Service Providers"

## 2014-07-24 ENCOUNTER — Encounter: Payer: Self-pay | Admitting: Rehabilitative and Restorative Service Providers"

## 2014-07-24 DIAGNOSIS — H8112 Benign paroxysmal vertigo, left ear: Secondary | ICD-10-CM | POA: Insufficient documentation

## 2014-07-24 NOTE — Therapy (Signed)
[facility] [address] [place], [place], [address] Phone: [phone]   Fax:  [phone]  Physical Therapy Evaluation  Patient Details  Name: Ashlee Hooper MRN: [id number] Date of Birth: 20-Dec-1955  Encounter Date: 07/24/2014      PT End of Session - 07/24/14 1648    Visit Number 1  G code (1)   Number of Visits 6   Date for PT Re-Evaluation 08/23/14   PT Start Time 1340   PT Stop Time 1425   PT Time Calculation (min) 45 min   Activity Tolerance Patient tolerated treatment well      Past Medical History  Diagnosis Date  . Mixed hyperlipidemia   . Fibromyalgia   . Peripheral neuropathy   . Migraine   . Backache, unspecified   . Venous insufficiency   . Insomnia   . Menopause   . Vertigo   . Chronic fatigue   . Dizziness and giddiness 04/28/2014  . Headache(784.0) 04/28/2014    Past Surgical History  Procedure Laterality Date  . Tonsillectomy    . Laparoscopy    . Colonoscopy    . Total abdominal hysterectomy w/ bilateral salpingoophorectomy    . Hemorrhoid surgery      There were no vitals taken for this visit.  Visit Diagnosis:  BPPV (benign paroxysmal positional vertigo), left      Subjective Assessment - 07/24/14 1542    Symptoms The patient reports h/o positional vertigo.  She reports feeling dizzy beginning Dec 2014.  She took meclizine to try to help and still experienced symptoms 1-2 x/week.  She felt that symtpoms worsened and she not feels headaches worsening, lips, teeth, and mouth numb.  This sensation worsens throughout the day and numbness occurs t/o her head.  She reports she saw a PT for vestibular rehab and he did epley's maneuver, which made symptoms worse.  Pt reports she tried to have CT scan, which insurance declined.  Her cc: are dizziness with all movements and she feels that her body is moving constantly.  She c/o visual waviness (oscillopsia).  More spinning and dizziness once out of  bed that is aggravated by movement.  Symptoms are also worse with later time of day.                                                                                                                      Patient Stated Goals Decrease dizziness.   Currently in Pain? Yes   Pain Score --  varies, dependent on weather   Pain Location --  ache "all over", TMJ, burning bottom of feet   Pain Descriptors / Indicators Aching;Burning   Pain Onset More than a month ago   Pain Frequency Constant   Aggravating Factors  worse with weather changes   Effect of Pain on Daily Activities Pt has multiple areas of pain. PT will monitor response to treatment, but no pain goal to follow due to nature of referral.  [employer] PT Assessment - 07/24/14 0001    Assessment   Medical Diagnosis BPPV   Prior Therapy --  treated with Epley's maneuver within past year   Precautions   Precautions Fall   Balance Screen   Has the patient fallen in the past 6 months Yes   How many times? --  2   Has the patient had a decrease in activity level because of a fear of falling?  Yes   Is the patient reluctant to leave their home because of a fear of falling?  Yes   [place] Private residence   Living Arrangements Spouse/significant other   Type of [place] to enter   Entrance Stairs-Number of Steps --  2-4, depending on entrance used   Entrance Stairs-Rails Can reach both   [place] One level   Prior Function   Level of Independence Needs assistance with homemaking            PT Education - 07/24/14 1648    Education provided Yes   Education Details HEP: brandt daroff habituation x 2 reps with instruction on monitoring HA symptoms with vestibular rehab   Person(s) Educated Patient   Methods Explanation;Demonstration;Verbal cues;Handout   Comprehension Returned demonstration;Verbalized understanding          PT Short Term Goals - 07/24/14 1653     PT SHORT TERM GOAL #1   Title The patient will be indep with HEP for habituation, and gaze as tolerated.  Target date:  08/23/2014   PT SHORT TERM GOAL #2   Title The patient will verbalize understanding of safe home walking program to tolerance.  Target date 08/23/2014          PT Long Term Goals - 07/24/14 1654    PT LONG TERM GOAL #1   Title The patient will report 40% improvement in symptoms of rocking and dizziness with movement.  Target date 09/22/2014   PT LONG TERM GOAL #2   Title The patient will be independent with HEP for post d/c activities to continue improving motion tolerance.  Target date 09/22/2014          Plan - 07/24/14 1649    Clinical Impression Statement The patient is a 58 yo female with multiple factors contributing to her clinical presentation of dizziness, rocking sensation, bouncing sensation and room movement.  At today's evaluation, she has nystagmus with positional testing consistent with L posterior and anterior canalithiasis.  PT used frenzel lenses to be able to view nystagmus.  Patient also has general rocking sensation rated 10/10, a shaking of her head sensation at rest, and numbness in her face.  Some of these symptoms do not appear vestibular in nature.  PT recommended we begin treating for BPPV and will pursue gaze activities, balance, and mobility activities if tolerated by patient.                                                                                 Pt will benefit from skilled therapeutic intervention in order to improve on the following deficits Abnormal gait;Difficulty walking;Decreased balance;Decreased mobility   Rehab Potential Fair  PT Frequency 1x / week   PT Duration 6 weeks   PT Treatment/Interventions Neuromuscular re-education;Functional mobility training;Balance training;Gait training;Patient/family education;Therapeutic activities;Therapeutic exercise   PT Next Visit Plan Review habituation, progress to home walking program  and mobility activites to tolerance.   Consulted and Agree with Plan of Care Patient          G-Codes - 2014/08/13 1655    Functional Limitation Self care   Self Care Current Status [phone]) At least 40 percent but less than 60 percent impaired, limited or restricted   Self Care Goal Status ([id number]) At least 1 percent but less than 20 percent impaired, limited or restricted             Vestibular Assessment - 08/13/2014 1552    Type of Dizziness --  Pt describes sensation of her moving up/down and shaking       Frequency of Dizziness --  constant   Aggravating Factors Activity in general   Relieving Factors No known relieving factors   Occulomotor Alignment Normal   Spontaneous Absent   Gaze-induced Absent   Smooth Pursuits Intact   Saccades Intact   VOR 1 Head Only (x 1 viewing) --  patient maintains gaze at slow pace   Comment head thrust test is positive for corrective refixation saccade noted   Dix-Hallpike Dix-Hallpike Right;Dix-Hallpike Left  used frenzel lenses to improve view of nystagmus (mild)   Dix-Hallpike Right Symptoms Downbeat, left rotatory nystagmus  indicating L anterior canalithiasis   Dix-Hallpike Left Symptoms Upbeat, left rotatory nystagmus         Vestibular Treatment/Exercise - 08-13-2014 1647    Vestibular Treatment Provided Canalith Repositioning;Habituation   Canalith Repositioning Epley Manuever Left   Habituation Exercises Nestor Lewandowsky   Number of Reps  --  1    RESPONSE DETAILS LEFT --  pt tolerated treatment well   Number of Reps  --  2   Symptom Description  --  increased headache and dizziness, recommended 2 reps         Problem List Patient Active Problem List   Diagnosis Date Noted  . Dizziness and giddiness 04/28/2014  . Headache 04/28/2014     Thank you for the referral of this patient.    Rudell Cobb, PT, MPT 08/13/2014 4:57 PM [facility] Phone: [phone] Fax: [phone]  [place] 13-Aug-2014, 4:56 PM

## 2014-07-24 NOTE — Addendum Note (Signed)
Addended by: Kathlen Mody, Margreta Journey M on: 07/24/2014 05:00 PM   Modules accepted: Orders

## 2014-07-24 NOTE — Patient Instructions (Signed)
Sit to Side-Lying   Sit on edge of bed. 1. Turn head 45 to right. 2. Maintain head position and lie down slowly on left side. Hold until symptoms subside. 3. Sit up slowly. Hold until symptoms subside. 4. Turn head 45 to left. 5. Maintain head position and lie down slowly on right side. Hold until symptoms subside. 6. Sit up slowly. Repeat sequence _2-3___ times per session. Do _2-3___ sessions per day.  Copyright  VHI. All rights reserved.  Special Instructions: Exercises may bring on mild to moderate symptoms of dizziness, headache that resolve within 30 minutes of completing exercises. If symptoms are lasting longer than 30 minutes, modify your exercises by:  >decreasing the # of times you complete each activity >ensuring your symptoms return to baseline before moving onto the next exercise >dividing up exercises so you do not do them all in one session, but multiple short sessions throughout the day >doing them once a day until symptoms improve

## 2014-08-05 ENCOUNTER — Other Ambulatory Visit: Payer: Self-pay | Admitting: Neurology

## 2014-08-10 ENCOUNTER — Ambulatory Visit: Payer: Medicare Other | Admitting: Physical Therapy

## 2014-08-10 ENCOUNTER — Encounter: Payer: Self-pay | Admitting: Physical Therapy

## 2014-08-10 DIAGNOSIS — H8112 Benign paroxysmal vertigo, left ear: Secondary | ICD-10-CM

## 2014-08-10 NOTE — Patient Instructions (Addendum)
Gaze Stabilization: Tip Card 1.Target must remain in focus, not blurry, and appear stationary while head is in motion. 2.Perform exercises with small head movements (45 to either side of midline). 3.Increase speed of head motion so long as target is in focus. 4.If you wear eyeglasses, be sure you can see target through lens (therapist will give specific instructions for bifocal / progressive lenses). 5.These exercises may provoke dizziness or nausea. Work through these symptoms. If too dizzy, slow head movement slightly. Rest between each exercise. 6.Exercises demand concentration; avoid distractions. 7.For safety, perform standing exercises close to a counter, wall, corner, or next to someone.  Copyright  VHI. All rights reserved.  Feet Apart (Compliant Surface) Varied Arm Positions - Eyes Open   With eyes open, standing on compliant surface: ________, feet shoulder width apart and arms out, look at a stationary object. Hold ____ seconds. Repeat ___1-2_ times per session. Do _1___ sessions per day.  Copyright  VHI. All rights reserved.  Feet Apart (Compliant Surface) Head Motion - Eyes Closed   Stand on compliant surface: _____pillow___ with feet shoulder width apart. Close eyes and move head slowly, up and down. Repeat _1-2___ times per session. Do __1__ sessions per day.  Copyright  VHI. All rights reserved.  Gaze Stabilization: Standing Feet Apart   Feet shoulder width apart, keeping eyes on target on wall __6__ feet away, tilt head down 15-30 and move head side to side for _60___ seconds. Repeat while moving head up and down for __60__ seconds. Do __3__ sessions per day. Repeat using target on pattern background.  Copyright  VHI. All rights reserved.

## 2014-08-10 NOTE — Therapy (Signed)
[facility] [address] [place], [place], [address] Phone: [phone]   Fax:  [phone]  Physical Therapy Treatment  Patient Details  Name: Ashlee Hooper MRN: [id number] Date of Birth: 11-14-1955  Encounter Date: 08/10/2014      PT End of Session - 08/10/14 2051    PT Start Time 52   PT Stop Time 1625   PT Time Calculation (min) 51 min      Past Medical History  Diagnosis Date  . Mixed hyperlipidemia   . Fibromyalgia   . Peripheral neuropathy   . Migraine   . Backache, unspecified   . Venous insufficiency   . Insomnia   . Menopause   . Vertigo   . Chronic fatigue   . Dizziness and giddiness 04/28/2014  . Headache(784.0) 04/28/2014    Past Surgical History  Procedure Laterality Date  . Tonsillectomy    . Laparoscopy    . Colonoscopy    . Total abdominal hysterectomy w/ bilateral salpingoophorectomy    . Hemorrhoid surgery      There were no vitals taken for this visit.  Visit Diagnosis:  BPPV (benign paroxysmal positional vertigo), left      Subjective Assessment - 08/10/14 2038    Symptoms ; reports doing HEP inconsistently - states that some days she just can't do them due to feeling so badly   Currently in Pain? Yes   Pain Score 10-Worst pain ever   Pain Location --  all over   Pain Onset More than a month ago                     Vestibular Treatment/Exercise - 08/10/14 2043    Habituation Exercises Nestor Lewandowsky   Number of Reps  2   Symptom Description  increased vertigo with return to upright     Pt. performed gaze stabilization in standing x 15 secs horizontal and vertical; performed balance on foam exercises with EO and EC with feet apart and feet together with horizontal and vertical head turns with CGA to min assist. Pt. reported incr. vertigo after doing these exercises due to movement increasing vertigo. Pt. Performed habituation including rolling from  supine to right and left side x 2 reps and reported incr. Vertigo in right sidelying position.         PT Education - 08/10/14 2045    Education Details walking program; standing on foam with EO and EC: gaze stabilization   Person(s) Educated Patient   Methods Explanation;Demonstration;Handout   Comprehension Verbalized understanding;Returned demonstration          PT Short Term Goals - 07/24/14 1653    PT SHORT TERM GOAL #1   Title The patient will be indep with HEP for habituation, and gaze as tolerated.  Target date:  08/23/2014   PT SHORT TERM GOAL #2   Title The patient will verbalize understanding of safe home walking program to tolerance.  Target date 08/23/2014           PT Long Term Goals - 07/24/14 1654    PT LONG TERM GOAL #1   Title The patient will report 40% improvement in symptoms of rocking and dizziness with movement.  Target date 09/22/2014   PT LONG TERM GOAL #2   Title The patient will be independent with HEP for post d/c activities to continue improving motion tolerance.  Target date 09/22/2014  Plan - 08/10/14 2046    Clinical Impression Statement Pt. has symptoms consistent with decr. vestibular function and dysequilibrium; no nystagmus observed in right nor left sidelying test nor with rolling onto right and left side from supine position   Pt will benefit from skilled therapeutic intervention in order to improve on the following deficits Abnormal gait;Difficulty walking;Decreased balance;Decreased mobility   Rehab Potential Fair   PT Frequency 1x / week   PT Duration 6 weeks   PT Treatment/Interventions Neuromuscular re-education;Functional mobility training;Balance training;Gait training;Patient/family education;Therapeutic activities;Therapeutic exercise   PT Next Visit Plan check updated HEP (balance on foam and gaze stabiliztion)   Consulted and Agree with Plan of Care Patient        Problem List Patient Active Problem List    Diagnosis Date Noted  . Dizziness and giddiness 04/28/2014  . Headache 04/28/2014    Alda Lea, PT 08/10/2014, 8:51 PM  [facility] [address] [place], [place], [address] Phone: [phone]   Fax:  [phone]

## 2014-08-27 ENCOUNTER — Telehealth: Payer: Self-pay | Admitting: Rehabilitative and Restorative Service Providers"

## 2014-08-28 ENCOUNTER — Ambulatory Visit: Payer: Medicare Other | Admitting: Rehabilitative and Restorative Service Providers"

## 2014-09-02 ENCOUNTER — Encounter: Payer: Self-pay | Admitting: Neurology

## 2014-09-02 ENCOUNTER — Ambulatory Visit (INDEPENDENT_AMBULATORY_CARE_PROVIDER_SITE_OTHER): Payer: Medicare Other | Admitting: Neurology

## 2014-09-02 VITALS — BP 119/67 | HR 71 | Ht 64.0 in | Wt 149.0 lb

## 2014-09-02 DIAGNOSIS — R42 Dizziness and giddiness: Secondary | ICD-10-CM

## 2014-09-02 DIAGNOSIS — G441 Vascular headache, not elsewhere classified: Secondary | ICD-10-CM | POA: Diagnosis not present

## 2014-09-02 MED ORDER — TOPIRAMATE 25 MG PO TABS: ORAL_TABLET | ORAL | Status: DC

## 2014-09-02 NOTE — Patient Instructions (Signed)

## 2014-09-02 NOTE — Progress Notes (Signed)
Reason for visit: Dizziness  Ashlee Hooper is an 60 y.o. female  History of present illness:  Ashlee Hooper is a 59 year old right-handed white female with a history of chronic neuromuscular symptoms associated with fibromyalgia, and chronic dizziness that began about one year ago. The dizziness is associated with a rocking sensation and an up-and-down motion sensation. She has had positional vertigo in the past, and she is in vestibular rehabilitation without much benefit so far. She has been on meclizine previously. She also describes headaches in the back of the neck and up into the occipital area of the head. The headaches are daily in nature. She has difficulty sleeping at night, she generally will fall sleep around 3:30 or 4 AM, and sleep off and on until 11 AM. The patient has had an occasional fall without injury. She indicates that when she goes out of the house, she will have sweats, increased dizziness. She returns to the office today for an evaluation. Previously, MRI evaluation of the brain showed a diffuse white matter change, a lumbar puncture was done, this was unremarkable.  Past Medical History  Diagnosis Date  . Mixed hyperlipidemia   . Fibromyalgia   . Peripheral neuropathy   . Migraine   . Backache, unspecified   . Venous insufficiency   . Insomnia   . Menopause   . Vertigo   . Chronic fatigue   . Dizziness and giddiness 04/28/2014  . Headache(784.0) 04/28/2014    Past Surgical History  Procedure Laterality Date  . Tonsillectomy    . Laparoscopy    . Colonoscopy    . Total abdominal hysterectomy w/ bilateral salpingoophorectomy    . Hemorrhoid surgery      Family History  Problem Relation Age of Onset  . Cirrhosis Father   . Alcohol abuse Father   . Fibromyalgia Sister     Social history:  reports that she has quit smoking. Her smoking use included Cigarettes. She has a 30 pack-year smoking history. She has never used smokeless tobacco. She reports  that she does not drink alcohol or use illicit drugs.    Allergies  Allergen Reactions  . Sulfa Antibiotics Nausea And Vomiting  . Bactrim [Sulfamethoxazole-Trimethoprim]   . Codeine Sulfate   . Penicillins   . Wellbutrin [Bupropion] Other (See Comments)  . Zoloft [Sertraline Hcl] Other (See Comments)    Medications:  Current Outpatient Prescriptions on File Prior to Visit  Medication Sig Dispense Refill  . Cholecalciferol (VITAMIN D3) 2000 UNITS TABS Take 1 tablet by mouth daily.    . Coenzyme Q10 (COQ10) 30 MG CAPS Take 60 mg by mouth daily.    Marland Kitchen estradiol (VIVELLE-DOT) 0.1 MG/24HR patch Place 1 patch onto the skin 2 (two) times a week.    . L-Lysine 500 MG CAPS Take 1 capsule by mouth daily.    . Omega-3 Fatty Acids (FISH OIL) 1000 MG CAPS Take 2 capsules by mouth daily.    Marland Kitchen oxycodone (ROXICODONE) 30 MG immediate release tablet Take 30 mg by mouth every 8 (eight) hours as needed for pain.    . OXYCONTIN 40 MG T12A 12 hr tablet Take 40 mg by mouth daily.    . polyethylene glycol (MIRALAX / GLYCOLAX) packet Take 527 g by mouth daily.    Marland Kitchen PRISTIQ 50 MG 24 hr tablet Take 50 mg by mouth daily.    . SUMAtriptan (IMITREX) 20 MG/ACT nasal spray Place 20 mg into the nose every 2 (two) hours as needed  for migraine or headache. May repeat in 2 hours if headache persists or recurs.    . temazepam (RESTORIL) 30 MG capsule Take 60 mg by mouth at bedtime.    Marland Kitchen tiZANidine (ZANAFLEX) 4 MG tablet TAKE 1 TABLET (4 MG TOTAL) BY MOUTH EVERY 6 (SIX) HOURS AS NEEDED FOR MUSCLE SPASMS. 90 tablet 3  . triamterene-hydrochlorothiazide (MAXZIDE-25) 37.5-25 MG per tablet Take 1 tablet by mouth daily.    . valACYclovir (VALTREX) 1000 MG tablet Take 1,000 mg by mouth as needed.     . vitamin B-12 (CYANOCOBALAMIN) 1000 MCG tablet Take 1,000 mcg by mouth daily.    . vitamin C (ASCORBIC ACID) 500 MG tablet Take 500 mg by mouth daily.    . vitamin E 400 UNIT capsule Take 400 Units by mouth daily.     No current  facility-administered medications on file prior to visit.    ROS:  Out of a complete 14 system review of symptoms, the patient complains only of the following symptoms, and all other reviewed systems are negative.  Dizziness Headache Neuromuscular pain Sleep disorder  Blood pressure 119/67, pulse 71, height 5\' 4"  (1.626 m), weight 149 lb (67.586 kg).  Physical Exam  General: The patient is alert and cooperative at the time of the examination.  Skin: No significant peripheral edema is noted.   Neurologic Exam  Mental status: The patient is oriented x 3.  Cranial nerves: Facial symmetry is present. Speech is normal, no aphasia or dysarthria is noted. Extraocular movements are full. Visual fields are full.  Motor: The patient has good strength in all 4 extremities.  Sensory examination: Soft touch sensation is symmetric on the face, arms, and legs.  Coordination: The patient has good finger-nose-finger and heel-to-shin bilaterally.  Gait and station: The patient has a normal gait. Tandem gait is slightly unsteady. Romberg is negative. No drift is seen.  Reflexes: Deep tendon reflexes are symmetric.   Assessment/Plan:  1. Fibromyalgia  2. Chronic dizziness, vertigo  3. Cervicogenic headache  4. Anxiety and depression  The patient is having dizziness that increases when she goes out of the house, associated with sweats. This may be a manifestation of anxiety. I have recommended putting her on diazepam, but she indicates that this worsened her depression previously. I will try Topamax. The patient is on tizanidine as well. The patient will complete her vestibular therapy. She will follow-up in 3-4 months.  Jill Alexanders MD 09/02/2014 6:42 PM  [facility] [address] [place],  [address]  Phone [phone] Fax [phone]

## 2014-09-11 ENCOUNTER — Ambulatory Visit: Payer: Medicare Other | Admitting: Rehabilitative and Restorative Service Providers"

## 2014-09-21 ENCOUNTER — Telehealth: Payer: Self-pay | Admitting: Neurology

## 2014-09-21 MED ORDER — CLONAZEPAM 0.5 MG PO TABS: 0.5000 mg | ORAL_TABLET | Freq: Two times a day (BID) | ORAL | Status: DC

## 2014-09-21 NOTE — Telephone Encounter (Signed)
I called patient. She does not believe that Topamax is helping, she will come off the medication, we will try low-dose clonazepam. She is to watch out for worsening depression on this medication.

## 2014-09-21 NOTE — Telephone Encounter (Signed)
Pt is calling needing to speak with the doctor regarding the new medication she is on topiramate (TOPAMAX) 25 MG tablet she takes 2 a night.  She states that it is not working and she does not want to be on it. She states it's making her headaches worse and making her stay up later than she normally does.  Please call and advise.

## 2014-10-20 DIAGNOSIS — F331 Major depressive disorder, recurrent, moderate: Secondary | ICD-10-CM | POA: Diagnosis not present

## 2014-11-02 ENCOUNTER — Encounter: Payer: Self-pay | Admitting: Rehabilitative and Restorative Service Providers"

## 2014-11-02 NOTE — Therapy (Signed)
[facility] [address] [place], [place], [address] Phone: [phone]   Fax:  [phone]  Patient Details  Name: Ashlee Hooper MRN: [id number] Date of Birth: 03-Feb-1956 Referring Provider:  No ref. provider found  Encounter Date: 11/02/2014  PHYSICAL THERAPY DISCHARGE SUMMARY  Visits from Start of Care: 2  Current functional level related to goals / functional outcomes: Patient called to cx remaining visits due to change in status with med change.  PT treated for 2 sessions.   Remaining deficits: See eval.   Education / Equipment: HEP provided.  Plan: Patient agrees to discharge.  Patient goals were not met. Patient is being discharged due to a change in medical status.  ?????   Thank you for the referral of this patient.    Toluwani Ruder 11/02/2014, 2:14 PM  [facility] [address] [place], [place], [address] Phone: [phone]   Fax:  [phone]

## 2014-11-23 ENCOUNTER — Other Ambulatory Visit: Payer: Self-pay | Admitting: Neurology

## 2014-11-23 NOTE — Telephone Encounter (Signed)
Rx signed and faxed.

## 2014-12-04 ENCOUNTER — Other Ambulatory Visit: Payer: Self-pay | Admitting: Neurology

## 2014-12-04 NOTE — Telephone Encounter (Signed)
Per note from 09/23

## 2015-02-05 DIAGNOSIS — R42 Dizziness and giddiness: Secondary | ICD-10-CM | POA: Diagnosis not present

## 2015-02-05 DIAGNOSIS — H903 Sensorineural hearing loss, bilateral: Secondary | ICD-10-CM | POA: Diagnosis not present

## 2015-02-09 DIAGNOSIS — Z124 Encounter for screening for malignant neoplasm of cervix: Secondary | ICD-10-CM | POA: Diagnosis not present

## 2015-02-09 DIAGNOSIS — Z01419 Encounter for gynecological examination (general) (routine) without abnormal findings: Secondary | ICD-10-CM | POA: Diagnosis not present

## 2015-02-09 DIAGNOSIS — Z6827 Body mass index (BMI) 27.0-27.9, adult: Secondary | ICD-10-CM | POA: Diagnosis not present

## 2015-02-09 DIAGNOSIS — M8588 Other specified disorders of bone density and structure, other site: Secondary | ICD-10-CM | POA: Diagnosis not present

## 2015-02-09 DIAGNOSIS — N958 Other specified menopausal and perimenopausal disorders: Secondary | ICD-10-CM | POA: Diagnosis not present

## 2015-02-10 ENCOUNTER — Other Ambulatory Visit: Payer: Self-pay | Admitting: Obstetrics & Gynecology

## 2015-02-10 DIAGNOSIS — R921 Mammographic calcification found on diagnostic imaging of breast: Secondary | ICD-10-CM

## 2015-02-10 DIAGNOSIS — Z1272 Encounter for screening for malignant neoplasm of vagina: Secondary | ICD-10-CM | POA: Diagnosis not present

## 2015-02-11 LAB — CYTOLOGY - PAP

## 2015-02-17 ENCOUNTER — Telehealth: Payer: Self-pay | Admitting: Neurology

## 2015-02-17 NOTE — Telephone Encounter (Signed)
Patient called stating her symptoms have not gotten worse but have not gotten better. Please call advise. Patient can be reached at [phone].

## 2015-02-18 NOTE — Telephone Encounter (Signed)
I called the patient. The patient continues to have ongoing dizziness. She started pristiq in September 2014 and the dizziness started in December 2014. Not sure if this is a causal agent, but coming off of the medication may help. Medications certainly can cause dizziness symptoms.

## 2015-02-18 NOTE — Telephone Encounter (Signed)
I called the patient. She stated the Klonopin is not helping with her dizziness. She said it does help her sleep though and she would like to continue taking it for that reason. But she would like to try something else for the dizziness. She wondered if the Pristiq could be causing it. She stated her dizziness seemed to start shortly after starting Pristiq.

## 2015-02-18 NOTE — Telephone Encounter (Signed)
I called the patient. She stated she was not awake enough yet and asked if I could call back later. I told her I would call this afternoon.

## 2015-02-25 ENCOUNTER — Other Ambulatory Visit: Payer: Self-pay | Admitting: Diagnostic Neuroimaging

## 2015-02-25 ENCOUNTER — Telehealth: Payer: Self-pay | Admitting: Neurology

## 2015-02-25 NOTE — Telephone Encounter (Signed)
Last prescribed in Oct per note on 10/26

## 2015-02-25 NOTE — Telephone Encounter (Signed)
I called the patient. The Fioricet prescription was given in October for a spinal headache. I cautioned her about using the medication frequently, as this can cause rebound headaches. The patient seems to indicate that she does not take the medication daily. I will call the prescription in, no more than 30 tablets in a month.

## 2015-02-25 NOTE — Telephone Encounter (Signed)
Rx signed and faxed.

## 2015-03-16 ENCOUNTER — Ambulatory Visit (INDEPENDENT_AMBULATORY_CARE_PROVIDER_SITE_OTHER): Payer: Medicare Other | Admitting: Neurology

## 2015-03-16 ENCOUNTER — Other Ambulatory Visit: Payer: Self-pay | Admitting: Neurology

## 2015-03-16 ENCOUNTER — Encounter: Payer: Self-pay | Admitting: Neurology

## 2015-03-16 VITALS — BP 117/80 | HR 69 | Ht 64.0 in | Wt 162.8 lb

## 2015-03-16 DIAGNOSIS — R930 Abnormal findings on diagnostic imaging of skull and head, not elsewhere classified: Secondary | ICD-10-CM | POA: Diagnosis not present

## 2015-03-16 DIAGNOSIS — G441 Vascular headache, not elsewhere classified: Secondary | ICD-10-CM | POA: Diagnosis not present

## 2015-03-16 DIAGNOSIS — R42 Dizziness and giddiness: Secondary | ICD-10-CM

## 2015-03-16 DIAGNOSIS — R9089 Other abnormal findings on diagnostic imaging of central nervous system: Secondary | ICD-10-CM

## 2015-03-16 MED ORDER — CLONAZEPAM 0.5 MG PO TABS: ORAL_TABLET | ORAL | Status: DC

## 2015-03-16 NOTE — Patient Instructions (Addendum)
We will go up on the clonazepam taking 1.5 mg at night. You will go down on the temazepam to 1 tablet at night. We will check MRI of the brain, compared to the study done one year ago.  Dizziness Dizziness is a common problem. It is a feeling of unsteadiness or light-headedness. You may feel like you are about to faint. Dizziness can lead to injury if you stumble or fall. A person of any age group can suffer from dizziness, but dizziness is more common in older adults. CAUSES  Dizziness can be caused by many different things, including:  Middle ear problems.  Standing for too long.  Infections.  An allergic reaction.  Aging.  An emotional response to something, such as the sight of blood.  Side effects of medicines.  Tiredness.  Problems with circulation or blood pressure.  Excessive use of alcohol or medicines, or illegal drug use.  Breathing too fast (hyperventilation).  An irregular heart rhythm (arrhythmia).  A low red blood cell count (anemia).  Pregnancy.  Vomiting, diarrhea, fever, or other illnesses that cause body fluid loss (dehydration).  Diseases or conditions such as Parkinson's disease, high blood pressure (hypertension), diabetes, and thyroid problems.  Exposure to extreme heat. DIAGNOSIS  Your health care provider will ask about your symptoms, perform a physical exam, and perform an electrocardiogram (ECG) to record the electrical activity of your heart. Your health care provider may also perform other heart or blood tests to determine the cause of your dizziness. These may include:  Transthoracic echocardiogram (TTE). During echocardiography, sound waves are used to evaluate how blood flows through your heart.  Transesophageal echocardiogram (TEE).  Cardiac monitoring. This allows your health care provider to monitor your heart rate and rhythm in real time.  Holter monitor. This is a portable device that records your heartbeat and can help  diagnose heart arrhythmias. It allows your health care provider to track your heart activity for several days if needed.  Stress tests by exercise or by giving medicine that makes the heart beat faster. TREATMENT  Treatment of dizziness depends on the cause of your symptoms and can vary greatly. HOME CARE INSTRUCTIONS   Drink enough fluids to keep your urine clear or pale yellow. This is especially important in very hot weather. In older adults, it is also important in cold weather.  Take your medicine exactly as directed if your dizziness is caused by medicines. When taking blood pressure medicines, it is especially important to get up slowly.  Rise slowly from chairs and steady yourself until you feel okay.  In the morning, first sit up on the side of the bed. When you feel okay, stand slowly while holding onto something until you know your balance is fine.  Move your legs often if you need to stand in one place for a long time. Tighten and relax your muscles in your legs while standing.  Have someone stay with you for 1-2 days if dizziness continues to be a problem. Do this until you feel you are well enough to stay alone. Have the person call your health care provider if he or she notices changes in you that are concerning.  Do not drive or use heavy machinery if you feel dizzy.  Do not drink alcohol. SEEK IMMEDIATE MEDICAL CARE IF:   Your dizziness or light-headedness gets worse.  You feel nauseous or vomit.  You have problems talking, walking, or using your arms, hands, or legs.  You feel weak.  You are not thinking clearly or you have trouble forming sentences. It may take a friend or family member to notice this.  You have chest pain, abdominal pain, shortness of breath, or sweating.  Your vision changes.  You notice any bleeding.  You have side effects from medicine that seems to be getting worse rather than better. MAKE SURE YOU:   Understand these  instructions.  Will watch your condition.  Will get help right away if you are not doing well or get worse. Document Released: 01/24/2001 Document Revised: 08/05/2013 Document Reviewed: 02/17/2011 [facility] Patient Information 2015 [place], [place]. This information is not intended to replace advice given to you by your health care provider. Make sure you discuss any questions you have with your health care provider.

## 2015-03-16 NOTE — Progress Notes (Signed)
Reason for visit: Dizziness  Ashlee Hooper is an 59 y.o. female  History of present illness:  Ashlee Hooper is a 59 year old right-handed white female with a history of underlying depression. The patient has a history of migraine headaches, but she also reports a chronic history of a rocking sensation that is present at all times, and has gradually worsened over time. MRI brain evaluation done one year ago showed diffuse white matter abnormalities, lumbar puncture and blood work at that time was unremarkable. The patient continues to have issues with the dizziness that is affecting her balance, she may fall on occasion. She has been on a taper off of the Pristiq to see if this improves the symptoms. The patient has actually felt worse as she has come off the medication, and she comes into the office today for an evaluation. She reports that her depression is getting worse, and her headaches have become more frequent. Before the Pristiq taper, her headaches were fairly infrequent, now they are every other day. The patient has chronic insomnia, she takes clonazepam 1 mg at night which seems to help, but she is also on 60 mg of Restoril at night. She occasionally will have a vibration sensation along with a rocking sensation. She is on tizanidine, but she reports that this has not helped her symptoms. She returns for an evaluation.  Past Medical History  Diagnosis Date  . Mixed hyperlipidemia   . Fibromyalgia   . Peripheral neuropathy   . Migraine   . Backache, unspecified   . Venous insufficiency   . Insomnia   . Menopause   . Vertigo   . Chronic fatigue   . Dizziness and giddiness 04/28/2014  . Headache(784.0) 04/28/2014    Past Surgical History  Procedure Laterality Date  . Tonsillectomy    . Laparoscopy    . Colonoscopy    . Total abdominal hysterectomy w/ bilateral salpingoophorectomy    . Hemorrhoid surgery      Family History  Problem Relation Age of Onset  . Cirrhosis  Father   . Alcohol abuse Father   . Fibromyalgia Sister     Social history:  reports that she has quit smoking. Her smoking use included Cigarettes. She has a 30 pack-year smoking history. She has never used smokeless tobacco. She reports that she does not drink alcohol or use illicit drugs.    Allergies  Allergen Reactions  . Sulfa Antibiotics Nausea And Vomiting  . Bactrim [Sulfamethoxazole-Trimethoprim]   . Codeine Sulfate   . Penicillins   . Wellbutrin [Bupropion] Other (See Comments)  . Zoloft [Sertraline Hcl] Other (See Comments)    Medications:  Prior to Admission medications   Medication Sig Start Date End Date Taking? Authorizing Provider  butalbital-acetaminophen-caffeine (FIORICET, ESGIC) 50-325-40 MG per tablet Take 1 tablet by mouth every 6 (six) hours as needed for headache. Must last 28 days 02/25/15  Yes Kathrynn Ducking, MD  Cholecalciferol (VITAMIN D3) 2000 UNITS TABS Take 1 tablet by mouth daily.   Yes Historical Provider, MD  clonazePAM (KLONOPIN) 0.5 MG tablet TAKE 1 TABLET BY MOUTH TWICE A DAY 11/23/14  Yes Kathrynn Ducking, MD  Coenzyme Q10 (COQ10) 30 MG CAPS Take 60 mg by mouth daily.   Yes Historical Provider, MD  estradiol (VIVELLE-DOT) 0.1 MG/24HR patch Place 1 patch onto the skin 2 (two) times a week.   Yes Historical Provider, MD  L-Lysine 500 MG CAPS Take 1 capsule by mouth daily.   Yes Historical  Provider, MD  Omega-3 Fatty Acids (FISH OIL) 1000 MG CAPS Take 2 capsules by mouth daily.   Yes Historical Provider, MD  oxycodone (ROXICODONE) 30 MG immediate release tablet Take 30 mg by mouth every 8 (eight) hours as needed for pain.   Yes Historical Provider, MD  OXYCONTIN 40 MG T12A 12 hr tablet Take 40 mg by mouth daily. 04/23/14  Yes Historical Provider, MD  polyethylene glycol (MIRALAX / GLYCOLAX) packet Take 527 g by mouth daily.   Yes Historical Provider, MD  PRISTIQ 50 MG 24 hr tablet Take 50 mg by mouth daily. 04/21/14  Yes Historical Provider, MD    SUMAtriptan (IMITREX) 20 MG/ACT nasal spray Place 20 mg into the nose every 2 (two) hours as needed for migraine or headache. May repeat in 2 hours if headache persists or recurs.   Yes Historical Provider, MD  temazepam (RESTORIL) 30 MG capsule Take 60 mg by mouth at bedtime. 04/12/14  Yes Historical Provider, MD  tiZANidine (ZANAFLEX) 4 MG tablet TAKE 1 TABLET (4 MG TOTAL) BY MOUTH EVERY 6 (SIX) HOURS AS NEEDED FOR MUSCLE SPASMS. 12/04/14  Yes Kathrynn Ducking, MD  triamterene-hydrochlorothiazide (MAXZIDE-25) 37.5-25 MG per tablet Take 1 tablet by mouth daily.   Yes Historical Provider, MD  valACYclovir (VALTREX) 1000 MG tablet Take 1,000 mg by mouth as needed.    Yes Historical Provider, MD  vitamin B-12 (CYANOCOBALAMIN) 1000 MCG tablet Take 1,000 mcg by mouth daily.   Yes Historical Provider, MD  vitamin C (ASCORBIC ACID) 500 MG tablet Take 500 mg by mouth daily.   Yes Historical Provider, MD  vitamin E 400 UNIT capsule Take 400 Units by mouth daily.   Yes Historical Provider, MD    ROS:  Out of a complete 14 system review of symptoms, the patient complains only of the following symptoms, and all other reviewed systems are negative.  Decreased activity, fatigue Light sensitivity, blurred vision cold intolerance, heat intolerance, flushing Constipation, nausea Insomnia, daytime sleepiness Food allergies Joint pain, back pain, achy muscles, muscle cramps, walking difficulty Moles Dizziness, headache, numbness Depression, anxiety, OCD   Blood pressure 117/80, pulse 69, height 5\' 4"  (1.626 m), weight 162 lb 12.8 oz (73.846 kg).  Physical Exam  General: The patient is alert and cooperative at the time of the examination. Affect is somewhat flat.  Skin: No significant peripheral edema is noted.   Neurologic Exam  Mental status: The patient is alert and oriented x 3 at the time of the examination. The patient has apparent normal recent and remote memory, with an apparently normal  attention span and concentration ability.   Cranial nerves: Facial symmetry is present. Speech is normal, no aphasia or dysarthria is noted. Extraocular movements are full. Visual fields are full.  Motor: The patient has good strength in all 4 extremities.  Sensory examination: Soft touch sensation is symmetric on the face, arms, and legs.  Coordination: The patient has good finger-nose-finger and heel-to-shin bilaterally.  Gait and station: The patient has a normal gait. Tandem gait is slightly unsteady. Romberg is negative. No drift is seen.  Reflexes: Deep tendon reflexes are symmetric.   Assessment/Plan:  1. Migraine headache   2. Chronic dizziness   3. Depression   4. Chronic insomnia   The patient will be increased on the clonazepam taking 1.5 mg at night, she will drop the Restoril dose to 30 mg at night. She will be set up for a repeat MRI of the brain to compare to the  study one year ago as her symptoms have worsened. The patient will remain off of Pristiq for 6 weeks to see if this improves her symptoms, if not, she will go back on the medication. The patient reports no benefit with the tizanidine, she will taper off of this medication by 4 mg every week until she is off. She will follow-up in 4-6 months.    Jill Alexanders MD 03/16/2015 7:11 PM  [facility] [address] [place], [place] [address]  Phone [phone] Fax [phone]

## 2015-03-17 NOTE — Telephone Encounter (Signed)
OV note says: The patient reports no benefit with the tizanidine, she will taper off of this medication by 4 mg every week until she is off.

## 2015-03-29 ENCOUNTER — Telehealth: Payer: Self-pay

## 2015-03-29 ENCOUNTER — Telehealth: Payer: Self-pay | Admitting: Neurology

## 2015-03-29 NOTE — Telephone Encounter (Signed)
Pt called and is nauseous , headaches, and more emotional . She needs a new rx for clonazePAM (KLONOPIN) 0.5 MG tablet, States Dr. Geraldine Solar her to take three tablets per day and new rx needs to reflect. Her current rx will last her till Friday. May call [phone]

## 2015-03-29 NOTE — Telephone Encounter (Signed)
I called the patient. She is very emotional since being off of her Pristiq. She was worried about not having enough Klonopin now that she is taking 3 tablets at night. She states she is not taking one in the morning. I explained to her that the order Dr. Jannifer Franklin wrote is for 3 tablets daily and he ordered refills. She should be able to refill through her pharmacy. She also c/o a severe headache. She does not want to take the fioricet, imitrex, or any other medication. She wanted to let Dr. Jannifer Franklin know what was going on with her.

## 2015-03-29 NOTE — Telephone Encounter (Signed)
Patient stated that someone called to schedule her MRI early in the morning a couple weeks ago but she was too asleep to schedule it. She asked that someone call her back about this later in the day. I told her I would forward this to our MRI coordinator.

## 2015-03-29 NOTE — Telephone Encounter (Signed)
I called the patient. The patient is having increased side effects off of the Pristiq, she is having increased headache, nausea, and general feeling bad. The patient may need to go back on the Pristiq if she is unable to tolerate these withdrawal symptoms. For now, she will try to go a bit longer off of the medication.

## 2015-03-30 NOTE — Telephone Encounter (Signed)
I called/lmom for pt letting her know that [facility] her her MRI order and that they tried to contact her on 03/18/15. I left [employer] # so she could call them and schedule.   Thanks!

## 2015-04-14 ENCOUNTER — Other Ambulatory Visit: Payer: Self-pay | Admitting: Neurology

## 2015-04-22 DIAGNOSIS — F5101 Primary insomnia: Secondary | ICD-10-CM | POA: Diagnosis not present

## 2015-04-22 DIAGNOSIS — G603 Idiopathic progressive neuropathy: Secondary | ICD-10-CM | POA: Diagnosis not present

## 2015-04-22 DIAGNOSIS — Z1389 Encounter for screening for other disorder: Secondary | ICD-10-CM | POA: Diagnosis not present

## 2015-04-22 DIAGNOSIS — I872 Venous insufficiency (chronic) (peripheral): Secondary | ICD-10-CM | POA: Diagnosis not present

## 2015-04-22 DIAGNOSIS — M159 Polyosteoarthritis, unspecified: Secondary | ICD-10-CM | POA: Diagnosis not present

## 2015-05-04 DIAGNOSIS — M545 Low back pain: Secondary | ICD-10-CM | POA: Diagnosis not present

## 2015-05-04 DIAGNOSIS — E782 Mixed hyperlipidemia: Secondary | ICD-10-CM | POA: Diagnosis not present

## 2015-05-04 DIAGNOSIS — G603 Idiopathic progressive neuropathy: Secondary | ICD-10-CM | POA: Diagnosis not present

## 2015-05-04 DIAGNOSIS — M797 Fibromyalgia: Secondary | ICD-10-CM | POA: Diagnosis not present

## 2015-05-18 ENCOUNTER — Ambulatory Visit
Admission: RE | Admit: 2015-05-18 | Discharge: 2015-05-18 | Disposition: A | Discharge status: Home / Self Care | Payer: Medicare Other | Source: Ambulatory Visit | Attending: Obstetrics & Gynecology | Admitting: Obstetrics & Gynecology

## 2015-05-18 DIAGNOSIS — R928 Other abnormal and inconclusive findings on diagnostic imaging of breast: Secondary | ICD-10-CM | POA: Diagnosis not present

## 2015-05-18 DIAGNOSIS — R921 Mammographic calcification found on diagnostic imaging of breast: Secondary | ICD-10-CM

## 2015-06-02 ENCOUNTER — Other Ambulatory Visit: Payer: Self-pay | Admitting: Neurology

## 2015-06-05 ENCOUNTER — Ambulatory Visit
Admission: RE | Admit: 2015-06-05 | Discharge: 2015-06-05 | Disposition: A | Discharge status: Home / Self Care | Payer: Medicare Other | Source: Ambulatory Visit | Attending: Neurology | Admitting: Neurology

## 2015-06-05 DIAGNOSIS — R51 Headache: Secondary | ICD-10-CM | POA: Diagnosis not present

## 2015-06-05 DIAGNOSIS — R42 Dizziness and giddiness: Secondary | ICD-10-CM

## 2015-06-05 DIAGNOSIS — R9089 Other abnormal findings on diagnostic imaging of central nervous system: Secondary | ICD-10-CM

## 2015-06-05 DIAGNOSIS — G441 Vascular headache, not elsewhere classified: Secondary | ICD-10-CM

## 2015-06-07 ENCOUNTER — Telehealth: Payer: Self-pay | Admitting: Neurology

## 2015-06-07 NOTE — Telephone Encounter (Signed)
  I called the patient. The MRI the brain shows mild stable white matter changes. Not clear that this is related to her current symptoms.  MRI brain June 07, 2015:  IMPRESSION: This MRI of the brain without contrast shows mild hazy increased Flair signal in the periventricular white matter bilaterally. This finding is unchanged when compared to the prior MRI. The significance is uncertain but could represent microvascular ischemic changes or a genetic leukodystrophy. MS related demyelination is less likely.

## 2015-06-08 MED ORDER — CYCLOBENZAPRINE HCL ER 15 MG PO CP24: 15.0000 mg | ORAL_CAPSULE | Freq: Every day | ORAL | Status: DC

## 2015-06-08 NOTE — Telephone Encounter (Signed)
I called the patient. The patient has continued to have symptoms, she is having an occasional day without any rocking sensations. The patient is off of Pristiq completely. She indicates that clonazepam helps her sleep at night. I will try Amrix to see if this helps her symptoms. They are generally better in the morning, worse as the day goes on.

## 2015-06-08 NOTE — Addendum Note (Signed)
Addended by: Margette Fast on: 06/08/2015 06:13 PM   Modules accepted: Orders, Medications

## 2015-06-08 NOTE — Telephone Encounter (Signed)
I called the patient and relayed Dr. Jannifer Franklin' note below. She would like to speak to Dr. Jannifer Franklin about the plan for her. She states she is still feeling dizzy and that she needs relief.

## 2015-06-08 NOTE — Telephone Encounter (Signed)
Pt returned call . Please call [phone]. Thank you

## 2015-06-12 ENCOUNTER — Other Ambulatory Visit: Payer: Self-pay | Admitting: Neurology

## 2015-06-14 ENCOUNTER — Telehealth: Payer: Self-pay | Admitting: Neurology

## 2015-06-14 MED ORDER — TIZANIDINE HCL 4 MG PO TABS: ORAL_TABLET | ORAL | Status: DC

## 2015-06-14 NOTE — Telephone Encounter (Signed)
I called the patient back.  Says she discontinued Tizanidine as discussed at last OV.  She started taking Amrix, but indicates this did not benefit her.  Says she felt "wierd".  Was nauseated and fatigued when taking this med, so she decided to stop.  Questioning if she should go back to Tizanidine, or if something different can be recommended.  Please advise.  Thank you.

## 2015-06-14 NOTE — Telephone Encounter (Signed)
Pt called and is asking for a refill on tiZANidine (ZANAFLEX,  Pt would like to know if she should continue to take the medication and if so she needs a refill. Please call and advise [phone]

## 2015-06-14 NOTE — Telephone Encounter (Signed)
The patient has been unable to tolerate Amrix. She did better with the Zanaflex which seemed to improve some of her rocking sensations for short periods of time. I will get her back on the tizanidine.

## 2015-07-29 ENCOUNTER — Other Ambulatory Visit: Payer: Self-pay | Admitting: Neurology

## 2015-08-15 HISTORY — PX: CATARACT EXTRACTION: SUR2

## 2015-09-16 ENCOUNTER — Encounter: Payer: Self-pay | Admitting: Neurology

## 2015-09-16 ENCOUNTER — Ambulatory Visit (INDEPENDENT_AMBULATORY_CARE_PROVIDER_SITE_OTHER): Payer: Medicare Other | Admitting: Neurology

## 2015-09-16 VITALS — BP 112/68 | HR 76 | Ht 64.5 in | Wt 153.0 lb

## 2015-09-16 DIAGNOSIS — R42 Dizziness and giddiness: Secondary | ICD-10-CM | POA: Diagnosis not present

## 2015-09-16 DIAGNOSIS — G441 Vascular headache, not elsewhere classified: Secondary | ICD-10-CM

## 2015-09-16 NOTE — Patient Instructions (Signed)

## 2015-09-16 NOTE — Progress Notes (Signed)
Reason for visit: Dizziness  Ashlee Hooper is an 60 y.o. female  History of present illness:  Ashlee Hooper is a 60 year old right-handed white female with a history of episodic headaches and chronic dizziness. The patient has actually improved some since last seen, but she still has some dizziness every day. She may have 2 good days a week where she can function much better. She may fall on occasion associated with the dizziness. She does have intermittent headaches, but she does not relate the headaches to the dizziness. She has gone off of the Pristiq completely, she feels somewhat better off the medication. She is using clonazepam at night, this is helping her rest better. She takes tizanidine as this seemed to help her symptoms as well. She does not use a cane for ambulation. She returns to this office for an evaluation.   Past Medical History  Diagnosis Date  . Mixed hyperlipidemia   . Fibromyalgia   . Peripheral neuropathy ([place])   . Migraine   . Backache, unspecified   . Venous insufficiency   . Insomnia   . Menopause   . Vertigo   . Chronic fatigue   . Dizziness and giddiness 04/28/2014  . Headache(784.0) 04/28/2014    Past Surgical History  Procedure Laterality Date  . Tonsillectomy    . Laparoscopy    . Colonoscopy    . Total abdominal hysterectomy w/ bilateral salpingoophorectomy    . Hemorrhoid surgery      Family History  Problem Relation Age of Onset  . Cirrhosis Father   . Alcohol abuse Father   . Fibromyalgia Sister     Social history:  reports that she has quit smoking. Her smoking use included Cigarettes. She has a 30 pack-year smoking history. She has never used smokeless tobacco. She reports that she does not drink alcohol or use illicit drugs.    Allergies  Allergen Reactions  . Sulfa Antibiotics Nausea And Vomiting  . Bactrim [Sulfamethoxazole-Trimethoprim]   . Codeine Sulfate   . Penicillins   . Wellbutrin [Bupropion] Other (See Comments)    . Zoloft [Sertraline Hcl] Other (See Comments)    Medications:  Prior to Admission medications   Medication Sig Start Date End Date Taking? Authorizing Provider  butalbital-acetaminophen-caffeine (FIORICET, ESGIC) 50-325-40 MG per tablet Take 1 tablet by mouth every 6 (six) hours as needed for headache. Must last 28 days 02/25/15  Yes Kathrynn Ducking, MD  Cholecalciferol (VITAMIN D3) 2000 UNITS TABS Take 1 tablet by mouth daily.   Yes Historical Provider, MD  clonazePAM (KLONOPIN) 0.5 MG tablet TAKE 1 TABLET BY MOUTH IN THE MORNING AND 2 IN THE EVENING 07/29/15  Yes Kathrynn Ducking, MD  Coenzyme Q10 (COQ10) 30 MG CAPS Take 60 mg by mouth daily.   Yes Historical Provider, MD  estradiol (VIVELLE-DOT) 0.1 MG/24HR patch Place 1 patch onto the skin 2 (two) times a week.   Yes Historical Provider, MD  L-Lysine 500 MG CAPS Take 1 capsule by mouth daily.   Yes Historical Provider, MD  MAGNESIUM PO Take 2 tablets by mouth daily.   Yes Historical Provider, MD  Omega-3 Fatty Acids (FISH OIL) 1000 MG CAPS Take 2 capsules by mouth daily.   Yes Historical Provider, MD  oxycodone (ROXICODONE) 30 MG immediate release tablet Take 30 mg by mouth every 8 (eight) hours as needed for pain.   Yes Historical Provider, MD  OXYCONTIN 40 MG T12A 12 hr tablet Take 40 mg by mouth daily.  04/23/14  Yes Historical Provider, MD  polyethylene glycol (MIRALAX / GLYCOLAX) packet Take 527 g by mouth daily.   Yes Historical Provider, MD  pravastatin (PRAVACHOL) 40 MG tablet Take 40 mg by mouth daily.   Yes Historical Provider, MD  SUMAtriptan (IMITREX) 20 MG/ACT nasal spray Place 20 mg into the nose every 2 (two) hours as needed for migraine or headache. May repeat in 2 hours if headache persists or recurs.   Yes Historical Provider, MD  temazepam (RESTORIL) 30 MG capsule Take 30 mg by mouth at bedtime.  04/12/14  Yes Historical Provider, MD  tiZANidine (ZANAFLEX) 4 MG tablet 1 tablet 3 times daily for 2 weeks, then take 1 tablet 4  times daily 06/14/15  Yes Kathrynn Ducking, MD  triamterene-hydrochlorothiazide (MAXZIDE-25) 37.5-25 MG per tablet Take 1 tablet by mouth daily.   Yes Historical Provider, MD  valACYclovir (VALTREX) 1000 MG tablet Take 1,000 mg by mouth as needed.    Yes Historical Provider, MD  vitamin B-12 (CYANOCOBALAMIN) 1000 MCG tablet Take 1,000 mcg by mouth daily.   Yes Historical Provider, MD  vitamin C (ASCORBIC ACID) 500 MG tablet Take 500 mg by mouth daily.   Yes Historical Provider, MD  vitamin E 400 UNIT capsule Take 400 Units by mouth daily.   Yes Historical Provider, MD    ROS:  Out of a complete 14 system review of symptoms, the patient complains only of the following symptoms, and all other reviewed systems are negative.  Chills, fatigue, excessive sweating Light sensitivity, blurred vision Cold intolerance, heat intolerance Constipation, diarrhea, nausea, vomiting Restless legs, insomnia Food allergy Joint pain, back pain, aching muscles, muscle cramps, walking difficulty Moles Dizziness, headache, numbness, passing out Anxiety   Blood pressure 112/68, pulse 76, height 5' 4.5" (1.638 m), weight 153 lb (69.4 kg).  Physical Exam  General: The patient is alert and cooperative at the time of the examination.  Skin: No significant peripheral edema is noted.   Neurologic Exam  Mental status: The patient is alert and oriented x 3 at the time of the examination. The patient has apparent normal recent and remote memory, with an apparently normal attention span and concentration ability.   Cranial nerves: Facial symmetry is present. Speech is normal, no aphasia or dysarthria is noted. Extraocular movements are full. Visual fields are full.  Motor: The patient has good strength in all 4 extremities.  Sensory examination: Soft touch sensation is symmetric on the face, arms, and legs.  Coordination: The patient has good finger-nose-finger and heel-to-shin bilaterally.  Gait and  station: The patient has an unsteady gait. Tandem gait is unsteady. Romberg is positive, the patient tends to fall backwards. No drift is seen.  Reflexes: Deep tendon reflexes are symmetric.   MRI brain June 07, 2015:  IMPRESSION: This MRI of the brain without contrast shows mild hazy increased Flair signal in the periventricular white matter bilaterally. This finding is unchanged when compared to the prior MRI. The significance is uncertain but could represent microvascular ischemic changes or a genetic leukodystrophy. MS related demyelination is less likely.  * MRI scan images were reviewed online. I agree with the written report.    Assessment/Plan:  1. Chronic dizziness   2. Headache   3. Gait disturbance   4. Abnormal MRI brain  The patient reports some improvement in her symptoms. We will continue the current medications. She is having some days each week with minimal dizziness. She feels better than she had on her last  visit. She will follow-up in 6 months, sooner if needed.   Jill Alexanders MD 09/16/2015 6:51 PM  [facility] [address] [place], [place] [address]  Phone [phone] Fax [phone]

## 2015-10-15 DIAGNOSIS — H2513 Age-related nuclear cataract, bilateral: Secondary | ICD-10-CM | POA: Diagnosis not present

## 2015-10-28 DIAGNOSIS — H2513 Age-related nuclear cataract, bilateral: Secondary | ICD-10-CM | POA: Diagnosis not present

## 2015-10-28 DIAGNOSIS — H4423 Degenerative myopia, bilateral: Secondary | ICD-10-CM | POA: Diagnosis not present

## 2015-10-28 DIAGNOSIS — D3131 Benign neoplasm of right choroid: Secondary | ICD-10-CM | POA: Diagnosis not present

## 2015-11-17 DIAGNOSIS — H2511 Age-related nuclear cataract, right eye: Secondary | ICD-10-CM | POA: Diagnosis not present

## 2015-11-22 DIAGNOSIS — H2512 Age-related nuclear cataract, left eye: Secondary | ICD-10-CM | POA: Diagnosis not present

## 2015-11-24 DIAGNOSIS — H269 Unspecified cataract: Secondary | ICD-10-CM | POA: Diagnosis not present

## 2015-11-24 DIAGNOSIS — H2512 Age-related nuclear cataract, left eye: Secondary | ICD-10-CM | POA: Diagnosis not present

## 2015-11-25 ENCOUNTER — Other Ambulatory Visit: Payer: Self-pay | Admitting: Neurology

## 2015-11-25 NOTE — Telephone Encounter (Signed)
Called and spoke to pharmacy staff member. She states d/t Dr Jannifer Franklin writing original rx "1 tablet 3 times daily for 2 weeks, then take 1 tablet 4 times daily"  They supplied pt with 30 day supply. If she now take 1 tablet 2x daily they will need a new rx for this. Advised I will clarify and send in rx refill. She verbalized understanding.

## 2016-01-09 ENCOUNTER — Other Ambulatory Visit: Payer: Self-pay | Admitting: Neurology

## 2016-01-12 NOTE — Telephone Encounter (Signed)
Rx printed, signed, faxed to pharmacy. 

## 2016-01-26 ENCOUNTER — Other Ambulatory Visit: Payer: Self-pay | Admitting: Neurology

## 2016-01-26 NOTE — Telephone Encounter (Signed)
Clonazepam refilled 

## 2016-01-27 NOTE — Telephone Encounter (Signed)
Rx faxed to pharmacy  

## 2016-02-21 DIAGNOSIS — R5383 Other fatigue: Secondary | ICD-10-CM | POA: Diagnosis not present

## 2016-02-21 DIAGNOSIS — Z01419 Encounter for gynecological examination (general) (routine) without abnormal findings: Secondary | ICD-10-CM | POA: Diagnosis not present

## 2016-02-21 DIAGNOSIS — Z6823 Body mass index (BMI) 23.0-23.9, adult: Secondary | ICD-10-CM | POA: Diagnosis not present

## 2016-02-23 ENCOUNTER — Other Ambulatory Visit: Payer: Self-pay | Admitting: Neurology

## 2016-02-24 NOTE — Telephone Encounter (Signed)
Rx printed, signed, faxed to pharmacy. 

## 2016-03-15 ENCOUNTER — Ambulatory Visit (INDEPENDENT_AMBULATORY_CARE_PROVIDER_SITE_OTHER): Payer: Medicare Other | Admitting: Neurology

## 2016-03-15 ENCOUNTER — Encounter: Payer: Self-pay | Admitting: Neurology

## 2016-03-15 VITALS — BP 117/76 | HR 69 | Ht 64.0 in | Wt 125.5 lb

## 2016-03-15 DIAGNOSIS — R42 Dizziness and giddiness: Secondary | ICD-10-CM

## 2016-03-15 DIAGNOSIS — G441 Vascular headache, not elsewhere classified: Secondary | ICD-10-CM | POA: Diagnosis not present

## 2016-03-15 MED ORDER — TIZANIDINE HCL 4 MG PO TABS: 4.0000 mg | ORAL_TABLET | Freq: Four times a day (QID) | ORAL | 5 refills | Status: DC

## 2016-03-15 MED ORDER — ONDANSETRON HCL 4 MG PO TABS: 4.0000 mg | ORAL_TABLET | Freq: Three times a day (TID) | ORAL | 1 refills | Status: DC | PRN

## 2016-03-15 NOTE — Patient Instructions (Addendum)

## 2016-03-15 NOTE — Progress Notes (Signed)
Reason for visit: Dizziness, headache  Ashlee Hooper is an 60 y.o. female  History of present illness:  Ashlee Hooper is a 60 year old right-handed white female with a history of episodic dizziness, the patient indicates that this occurs about every other day. She may have days that she feels fairly normal. The patient has one or 2 headaches a week, the patient indicates that the headaches and dizziness do not keep her from doing day-to-day activities. The patient may have nausea with the dizziness. She believes tizanidine has helped some, she takes Fioricet if needed for the headache. The patient is sleeping much better taking the clonazepam, 1.5 mg at night. Apparently she also takes Restoril at night, 30 mg. The patient has some gait instability when she is dizzy, she may fall on occasion. She denies that the headaches and the dizziness correlate with one another. She returns to this office for an evaluation.  Past Medical History:  Diagnosis Date  . Backache, unspecified   . Chronic fatigue   . Dizziness and giddiness 04/28/2014  . Fibromyalgia   . Headache(784.0) 04/28/2014  . Insomnia   . Menopause   . Migraine   . Mixed hyperlipidemia   . Peripheral neuropathy ([place])   . Venous insufficiency   . Vertigo     Past Surgical History:  Procedure Laterality Date  . CATARACT EXTRACTION  2017  . COLONOSCOPY    . HEMORRHOID SURGERY    . LAPAROSCOPY    . TONSILLECTOMY    . TOTAL ABDOMINAL HYSTERECTOMY W/ BILATERAL SALPINGOOPHORECTOMY      Family History  Problem Relation Age of Onset  . Cirrhosis Father   . Alcohol abuse Father   . Fibromyalgia Sister     Social history:  reports that she has quit smoking. Her smoking use included Cigarettes. She has a 30.00 pack-year smoking history. She has never used smokeless tobacco. She reports that she does not drink alcohol or use drugs.    Allergies  Allergen Reactions  . Sulfa Antibiotics Nausea And Vomiting  . Bactrim  [Sulfamethoxazole-Trimethoprim]   . Codeine Sulfate   . Penicillins   . Wellbutrin [Bupropion] Other (See Comments)  . Zoloft [Sertraline Hcl] Other (See Comments)    Medications:  Prior to Admission medications   Medication Sig Start Date End Date Taking? Authorizing Provider  butalbital-acetaminophen-caffeine (FIORICET, ESGIC) 50-325-40 MG tablet TABLET BY MOUTH EVERY 6 HOURS AS NEEDED FOR HEADACHE. MUST LAST 20 DAYS 01/12/16  Yes Kathrynn Ducking, MD  Cholecalciferol (VITAMIN D3) 2000 UNITS TABS Take 1 tablet by mouth daily.   Yes Historical Provider, MD  clonazePAM (KLONOPIN) 0.5 MG tablet TAKE 1 TABLET BY MOUTH EVERY MORNING AND 2 TABS IN THE EVENING 02/23/16  Yes Kathrynn Ducking, MD  Coenzyme Q10 (COQ10) 30 MG CAPS Take 60 mg by mouth daily.   Yes Historical Provider, MD  estradiol (VIVELLE-DOT) 0.1 MG/24HR patch Place 1 patch onto the skin 2 (two) times a week.   Yes Historical Provider, MD  L-Lysine 500 MG CAPS Take 1 capsule by mouth daily.   Yes Historical Provider, MD  MAGNESIUM PO Take 2 tablets by mouth daily.   Yes Historical Provider, MD  Omega-3 Fatty Acids (FISH OIL) 1000 MG CAPS Take 2 capsules by mouth daily.   Yes Historical Provider, MD  oxycodone (ROXICODONE) 30 MG immediate release tablet Take 30 mg by mouth every 8 (eight) hours as needed for pain.   Yes Historical Provider, MD  OXYCONTIN 40 MG  T12A 12 hr tablet Take 40 mg by mouth daily. 04/23/14  Yes Historical Provider, MD  polyethylene glycol (MIRALAX / GLYCOLAX) packet Take 527 g by mouth daily.   Yes Historical Provider, MD  pravastatin (PRAVACHOL) 40 MG tablet Take 40 mg by mouth daily.   Yes Historical Provider, MD  temazepam (RESTORIL) 30 MG capsule Take 30 mg by mouth at bedtime.  04/12/14  Yes Historical Provider, MD  tiZANidine (ZANAFLEX) 4 MG tablet Take 1 tablet (4 mg total) by mouth 4 (four) times daily. 11/25/15  Yes Kathrynn Ducking, MD  triamterene-hydrochlorothiazide (MAXZIDE-25) 37.5-25 MG per tablet  Take 1 tablet by mouth daily.   Yes Historical Provider, MD  valACYclovir (VALTREX) 1000 MG tablet Take 1,000 mg by mouth as needed.    Yes Historical Provider, MD  vitamin B-12 (CYANOCOBALAMIN) 1000 MCG tablet Take 1,000 mcg by mouth daily.   Yes Historical Provider, MD  vitamin C (ASCORBIC ACID) 500 MG tablet Take 500 mg by mouth daily.   Yes Historical Provider, MD  vitamin E 400 UNIT capsule Take 400 Units by mouth daily.   Yes Historical Provider, MD  SUMAtriptan (IMITREX) 20 MG/ACT nasal spray Place 20 mg into the nose every 2 (two) hours as needed for migraine or headache. May repeat in 2 hours if headache persists or recurs.    Historical Provider, MD    ROS:  Out of a complete 14 system review of symptoms, the patient complains only of the following symptoms, and all other reviewed systems are negative.  Fatigue Light sensitivity Cold intolerance, heat intolerance Abdominal pain, irritable bowel syndrome, constipation, diarrhea, nausea Insomnia Food allergies Back pain, achy muscles, walking difficulty Moles Dizziness, headache, numbness Anxiety  Blood pressure 117/76, pulse 69, height 5\' 4"  (1.626 m), weight 125 lb 8 oz (56.9 kg).  Physical Exam  General: The patient is alert and cooperative at the time of the examination.  Skin: No significant peripheral edema is noted.   Neurologic Exam  Mental status: The patient is alert and oriented x 3 at the time of the examination. The patient has apparent normal recent and remote memory, with an apparently normal attention span and concentration ability.   Cranial nerves: Facial symmetry is present. Speech is normal, no aphasia or dysarthria is noted. Extraocular movements are full. Visual fields are full.  Motor: The patient has good strength in all 4 extremities.  Sensory examination: Soft touch sensation is symmetric on the face, arms, and legs.  Coordination: The patient has good finger-nose-finger and heel-to-shin  bilaterally.  Gait and station: The patient has a slightly unsteady gait. Tandem gait is unsteady. Romberg is positive.  Reflexes: Deep tendon reflexes are symmetric.   Assessment/Plan:  1. Episodic dizziness  2. Headache  3. Fibromyalgia  4. Irritable bowel syndrome  The patient has actually gained some benefit over time, the episodes of dizziness and headache are less. The patient will be increased on the tizanidine taking one 3 times a day for several weeks, she does well with this she will go to 1 tablet 4 times daily. The patient will follow-up in 6 months, sooner if needed. A prescription for the tizanidine was sent in. The patient was given a prescription for Zofran for the nausea.  Jill Alexanders MD 03/15/2016 3:54 PM  [facility] [address] [place], [place] [address]  Phone [phone] Fax [phone]

## 2016-06-24 ENCOUNTER — Other Ambulatory Visit: Payer: Self-pay | Admitting: Neurology

## 2016-06-26 NOTE — Telephone Encounter (Signed)
Rx printed, signed, faxed to pharmacy. 

## 2016-07-05 DIAGNOSIS — E782 Mixed hyperlipidemia: Secondary | ICD-10-CM | POA: Diagnosis not present

## 2016-07-12 DIAGNOSIS — G603 Idiopathic progressive neuropathy: Secondary | ICD-10-CM | POA: Diagnosis not present

## 2016-07-12 DIAGNOSIS — G43109 Migraine with aura, not intractable, without status migrainosus: Secondary | ICD-10-CM | POA: Diagnosis not present

## 2016-07-12 DIAGNOSIS — M545 Low back pain: Secondary | ICD-10-CM | POA: Diagnosis not present

## 2016-07-12 DIAGNOSIS — E782 Mixed hyperlipidemia: Secondary | ICD-10-CM | POA: Diagnosis not present

## 2016-09-18 ENCOUNTER — Ambulatory Visit (INDEPENDENT_AMBULATORY_CARE_PROVIDER_SITE_OTHER): Payer: Medicare Other | Admitting: Neurology

## 2016-09-18 ENCOUNTER — Encounter: Payer: Self-pay | Admitting: Neurology

## 2016-09-18 VITALS — BP 127/77 | HR 81 | Ht 64.0 in | Wt 124.0 lb

## 2016-09-18 DIAGNOSIS — G441 Vascular headache, not elsewhere classified: Secondary | ICD-10-CM

## 2016-09-18 DIAGNOSIS — R42 Dizziness and giddiness: Secondary | ICD-10-CM

## 2016-09-18 NOTE — Progress Notes (Signed)
Reason for visit: Dizziness  Chalonda TAHJ PRALLE is an 61 y.o. female  History of present illness:  Ms. Mongan is a 61 year old right-handed white female with a history of intermittent dizziness. The patient is having dizziness about every other day, the dizziness may last all day long and is associated with a rocking sensation. The patient also has headaches on average about once a week. The patient may have some nausea and vomiting with the dizziness, and she also complains of discomfort throughout the body, particularly in the low back. The patient is on chronic opiate medications including OxyContin. She indicates that she does not take any type of antidepressant medication for her fibromyalgia such as Cymbalta as it makes her "crazy". The patient in the past was on Topamax for about 2 weeks, she indicated that the medication was not helping her and she stopped it. She returns to this office for an evaluation.  Past Medical History:  Diagnosis Date  . Backache, unspecified   . Chronic fatigue   . Dizziness and giddiness 04/28/2014  . Fibromyalgia   . Headache(784.0) 04/28/2014  . Insomnia   . Menopause   . Migraine   . Mixed hyperlipidemia   . Peripheral neuropathy ([place])   . Venous insufficiency   . Vertigo     Past Surgical History:  Procedure Laterality Date  . CATARACT EXTRACTION  2017  . COLONOSCOPY    . HEMORRHOID SURGERY    . LAPAROSCOPY    . TONSILLECTOMY    . TOTAL ABDOMINAL HYSTERECTOMY W/ BILATERAL SALPINGOOPHORECTOMY      Family History  Problem Relation Age of Onset  . Cirrhosis Father   . Alcohol abuse Father   . Fibromyalgia Sister     Social history:  reports that she has quit smoking. Her smoking use included Cigarettes. She has a 30.00 pack-year smoking history. She has never used smokeless tobacco. She reports that she does not drink alcohol or use drugs.    Allergies  Allergen Reactions  . Sulfa Antibiotics Nausea And Vomiting  . Bactrim  [Sulfamethoxazole-Trimethoprim]   . Codeine Sulfate   . Penicillins   . Wellbutrin [Bupropion] Other (See Comments)  . Zoloft [Sertraline Hcl] Other (See Comments)    Medications:  Prior to Admission medications   Medication Sig Start Date End Date Taking? Authorizing Provider  butalbital-acetaminophen-caffeine (FIORICET, ESGIC) 50-325-40 MG tablet TABLET BY MOUTH EVERY 6 HOURS AS NEEDED FOR HEADACHE. MUST LAST 20 DAYS 01/12/16  Yes Kathrynn Ducking, MD  Cholecalciferol (VITAMIN D3) 2000 UNITS TABS Take 1 tablet by mouth daily.   Yes Historical Provider, MD  clonazePAM (KLONOPIN) 0.5 MG tablet TAKE 1 TABLET BY MOUTH EVERY MORNING AND 2 TABLETS IN THE EVENING Patient taking differently: Takes 3 tablets before bedtime 06/26/16  Yes Kathrynn Ducking, MD  Coenzyme Q10 (COQ10) 30 MG CAPS Take 60 mg by mouth daily.   Yes Historical Provider, MD  estradiol (VIVELLE-DOT) 0.1 MG/24HR patch Place 1 patch onto the skin 2 (two) times a week.   Yes Historical Provider, MD  L-Lysine 500 MG CAPS Take 1 capsule by mouth daily.   Yes Historical Provider, MD  MAGNESIUM PO Take 2 tablets by mouth daily.   Yes Historical Provider, MD  Omega-3 Fatty Acids (FISH OIL) 1000 MG CAPS Take 2 capsules by mouth daily.   Yes Historical Provider, MD  ondansetron (ZOFRAN) 4 MG tablet Take 1 tablet (4 mg total) by mouth every 8 (eight) hours as needed for nausea or  vomiting. 03/15/16  Yes Kathrynn Ducking, MD  oxycodone (ROXICODONE) 30 MG immediate release tablet Take 30 mg by mouth every 8 (eight) hours as needed for pain.   Yes Historical Provider, MD  OXYCONTIN 40 MG T12A 12 hr tablet Take 40 mg by mouth daily. 04/23/14  Yes Historical Provider, MD  polyethylene glycol (MIRALAX / GLYCOLAX) packet Take 527 g by mouth daily.   Yes Historical Provider, MD  pravastatin (PRAVACHOL) 40 MG tablet Take 40 mg by mouth daily.   Yes Historical Provider, MD  SUMAtriptan (IMITREX) 20 MG/ACT nasal spray Place 20 mg into the nose every 2  (two) hours as needed for migraine or headache. May repeat in 2 hours if headache persists or recurs.   Yes Historical Provider, MD  tiZANidine (ZANAFLEX) 4 MG tablet Take 1 tablet (4 mg total) by mouth 4 (four) times daily. 03/15/16  Yes Kathrynn Ducking, MD  triamterene-hydrochlorothiazide (MAXZIDE-25) 37.5-25 MG per tablet Take 1 tablet by mouth daily.   Yes Historical Provider, MD  valACYclovir (VALTREX) 1000 MG tablet Take 1,000 mg by mouth as needed.    Yes Historical Provider, MD  vitamin B-12 (CYANOCOBALAMIN) 1000 MCG tablet Take 1,000 mcg by mouth daily.   Yes Historical Provider, MD  vitamin C (ASCORBIC ACID) 500 MG tablet Take 500 mg by mouth daily.   Yes Historical Provider, MD  vitamin E 400 UNIT capsule Take 400 Units by mouth daily.   Yes Historical Provider, MD    ROS:  Out of a complete 14 system review of symptoms, the patient complains only of the following symptoms, and all other reviewed systems are negative.  Dizziness Headache  Blood pressure 127/77, pulse 81, height 5\' 4"  (1.626 m), weight 124 lb (56.2 kg).  Physical Exam  General: The patient is alert and cooperative at the time of the examination.   Neuromuscular: Range of movement of the cervical spine and lumbar spine was full.  Skin: No significant peripheral edema is noted.   Neurologic Exam  Mental status: The patient is alert and oriented x 3 at the time of the examination. The patient has apparent normal recent and remote memory, with an apparently normal attention span and concentration ability.   Cranial nerves: Facial symmetry is present. Speech is normal, no aphasia or dysarthria is noted. Extraocular movements are full. Visual fields are full.  Motor: The patient has good strength in all 4 extremities.  Sensory examination: Soft touch sensation is symmetric on the face, arms, and legs.  Coordination: The patient has good finger-nose-finger and heel-to-shin bilaterally.  Gait and station: The  patient has a slightly unsteady gait. The patient does not walk with an assistive device. Tandem gait is unsteady. Romberg is positive, the patient has a tendency to fall backwards. No drift is seen.  Reflexes: Deep tendon reflexes are symmetric.   Assessment/Plan:  1. Chronic dizziness  2. Headache  The patient was never given a fair trial on Topamax, I have recommended that she give another trial on this medication for 2 or 3 months at least. The patient is not sure she wants to consider this treatment option. She will remain on tizanidine and clonazepam for now, she will follow-up through this office in 6 months.   Jill Alexanders MD 09/18/2016 4:03 PM  [facility] [address] [place], [place] [address]  Phone [phone] Fax [phone]

## 2016-12-25 ENCOUNTER — Other Ambulatory Visit: Payer: Self-pay | Admitting: Neurology

## 2016-12-26 ENCOUNTER — Other Ambulatory Visit: Payer: Self-pay | Admitting: Neurology

## 2016-12-26 ENCOUNTER — Telehealth: Payer: Self-pay | Admitting: Neurology

## 2016-12-26 MED ORDER — TIZANIDINE HCL 4 MG PO TABS: 4.0000 mg | ORAL_TABLET | Freq: Four times a day (QID) | ORAL | 5 refills | Status: DC

## 2016-12-26 NOTE — Telephone Encounter (Signed)
Christy called from [facility] [address] in reference to clonazePAM (KLONOPIN) 0.5 MG tablet.  New guidelines for patients insurance and for documentation they need to speak with nurse about potential side effects.  Please call

## 2016-12-26 NOTE — Telephone Encounter (Signed)
[facility]ed [facility] and spoke with [employer] had left for the day. Verdis Frederickson stated that they had to confirm that Dr Jannifer Franklin still wanted to prescribe clonazepam since patient is also taking Oxycodone, Oxycontin. This RN advised his office note in Feb stated she would remain on clonazepam. She has FU with Dr Jannifer Franklin in Aug 2018. Verdis Frederickson stated patient has been on Oxycodone since last Sept therefore Dr Jannifer Franklin would be aware because she has seen him since then. Maria verbalized appreciation for call back.

## 2016-12-27 NOTE — Telephone Encounter (Signed)
Clonazepam approve by Dr. Jannifer Franklin  [facility] [address]

## 2017-01-22 ENCOUNTER — Telehealth: Payer: Self-pay | Admitting: Neurology

## 2017-01-22 NOTE — Telephone Encounter (Signed)
Ashlee Hooper with [facility] [address] called office in reference to clonazePAM (KLONOPIN) 0.5 MG tablet patient is taking 2 other opioids and would like to make sure Dr. Jannifer Franklin is okay with patient taking all these medications together.  Please call

## 2017-01-22 NOTE — Telephone Encounter (Signed)
I called the pharmacist. The patient is on OxyContin taking 40 mg twice daily and she takes oxycodone 30 mg 3 times daily, she has been on these medications for quite some time.  The patient takes clonazepam in low dose taking 0.5 mg in the morning and 1 mg in the evening. She has been on all these medications for quite some time.

## 2017-02-19 ENCOUNTER — Other Ambulatory Visit: Payer: Self-pay | Admitting: Neurology

## 2017-02-19 ENCOUNTER — Telehealth: Payer: Self-pay | Admitting: *Deleted

## 2017-02-19 NOTE — Telephone Encounter (Signed)
Clonazepam rx printed, signed, faxed and confirmed to [facility] at [phone].

## 2017-03-21 ENCOUNTER — Ambulatory Visit: Payer: Medicare Other | Admitting: Neurology

## 2017-03-23 ENCOUNTER — Other Ambulatory Visit: Payer: Self-pay | Admitting: Neurology

## 2017-03-23 NOTE — Telephone Encounter (Signed)
Faxed printed/signed rx clonazepam to pt pharmacy. Received confirmation.  [id number] [place], [place] [phone] (Phone) [phone] (Fax)

## 2017-03-29 ENCOUNTER — Encounter: Payer: Self-pay | Admitting: Neurology

## 2017-03-29 ENCOUNTER — Ambulatory Visit (INDEPENDENT_AMBULATORY_CARE_PROVIDER_SITE_OTHER): Payer: Medicare Other | Admitting: Neurology

## 2017-03-29 ENCOUNTER — Encounter (INDEPENDENT_AMBULATORY_CARE_PROVIDER_SITE_OTHER): Payer: Self-pay

## 2017-03-29 VITALS — BP 105/74 | HR 82 | Ht 64.0 in | Wt 128.0 lb

## 2017-03-29 DIAGNOSIS — G441 Vascular headache, not elsewhere classified: Secondary | ICD-10-CM

## 2017-03-29 DIAGNOSIS — R42 Dizziness and giddiness: Secondary | ICD-10-CM

## 2017-03-29 NOTE — Progress Notes (Signed)
Reason for visit: Fibromyalgia, dizziness, headache  Ashlee Hooper is an 61 y.o. female  History of present illness:  Ashlee Hooper is a 61 year old right-handed white female with a history of chronic fatigue, chronic pain associated with fibromyalgia treated with opiate medications. The patient has had a chronic issue since December of 2014 of a rocking sensation that has never stopped. The patient is still able to operate a motor vehicle, but she feels as if she is going side to side or up and down. The patient may have some nausea with this. She does have headaches but her headaches have been under relatively good control recently. The patient may have one or 2 headaches a month. The headaches are controlled with medications. The patient returns to the office today for an evaluation. She denies that she has had any new issues since she was seen last.   Past Medical History:  Diagnosis Date  . Backache, unspecified   . Chronic fatigue   . Dizziness and giddiness 04/28/2014  . Fibromyalgia   . Headache(784.0) 04/28/2014  . Insomnia   . Menopause   . Migraine   . Mixed hyperlipidemia   . Peripheral neuropathy   . Venous insufficiency   . Vertigo     Past Surgical History:  Procedure Laterality Date  . CATARACT EXTRACTION  2017  . COLONOSCOPY    . HEMORRHOID SURGERY    . LAPAROSCOPY    . TONSILLECTOMY    . TOTAL ABDOMINAL HYSTERECTOMY W/ BILATERAL SALPINGOOPHORECTOMY      Family History  Problem Relation Age of Onset  . Cirrhosis Father   . Alcohol abuse Father   . Fibromyalgia Sister     Social history:  reports that she has quit smoking. Her smoking use included Cigarettes. She has a 30.00 pack-year smoking history. She has never used smokeless tobacco. She reports that she does not drink alcohol or use drugs.    Allergies  Allergen Reactions  . Sulfa Antibiotics Nausea And Vomiting  . Bactrim [Sulfamethoxazole-Trimethoprim]   . Codeine Sulfate   . Penicillins     . Wellbutrin [Bupropion] Other (See Comments)  . Zoloft [Sertraline Hcl] Other (See Comments)    Medications:  Prior to Admission medications   Medication Sig Start Date End Date Taking? Authorizing Provider  butalbital-acetaminophen-caffeine (FIORICET, ESGIC) 50-325-40 MG tablet TABLET BY MOUTH EVERY 6 HOURS AS NEEDED FOR HEADACHE. MUST LAST 20 DAYS 01/12/16  Yes Kathrynn Ducking, MD  Cholecalciferol (VITAMIN D3) 2000 UNITS TABS Take 1 tablet by mouth daily.   Yes [provider]  clonazePAM (KLONOPIN) 0.5 MG tablet TAKE 1 TABLET BY MOUTH EVERY MORNING AND TAKE 2 TABLETS BY MOUTH EVERY EVENING AS NEEDED 03/23/17  Yes Kathrynn Ducking, MD  Coenzyme Q10 (COQ10) 30 MG CAPS Take 60 mg by mouth daily.   Yes [provider]  estradiol (VIVELLE-DOT) 0.1 MG/24HR patch Place 1 patch onto the skin 2 (two) times a week.   Yes [provider]  L-Lysine 500 MG CAPS Take 1 capsule by mouth daily.   Yes [provider]  MAGNESIUM PO Take 2 tablets by mouth daily.   Yes [provider]  Omega-3 Fatty Acids (FISH OIL) 1000 MG CAPS Take 2 capsules by mouth daily.   Yes [provider]  ondansetron (ZOFRAN) 4 MG tablet Take 1 tablet (4 mg total) by mouth every 8 (eight) hours as needed for nausea or vomiting. 03/15/16  Yes Kathrynn Ducking, MD  oxycodone (  ROXICODONE) 30 MG immediate release tablet Take 30 mg by mouth every 8 (eight) hours as needed for pain.   Yes [provider]  OXYCONTIN 40 MG T12A 12 hr tablet Take 40 mg by mouth daily. 04/23/14  Yes [provider]  polyethylene glycol (MIRALAX / GLYCOLAX) packet Take 527 g by mouth daily.   Yes [provider]  pravastatin (PRAVACHOL) 40 MG tablet Take 40 mg by mouth daily.   Yes [provider]  SUMAtriptan (IMITREX) 20 MG/ACT nasal spray Place 20 mg into the nose every 2 (two) hours as needed for migraine or headache. May repeat in 2 hours if headache persists or  recurs.   Yes [provider]  tiZANidine (ZANAFLEX) 4 MG tablet Take 1 tablet (4 mg total) by mouth 4 (four) times daily. 12/26/16  Yes Kathrynn Ducking, MD  triamterene-hydrochlorothiazide (MAXZIDE-25) 37.5-25 MG per tablet Take 1 tablet by mouth daily.   Yes [provider]  valACYclovir (VALTREX) 1000 MG tablet Take 1,000 mg by mouth as needed.    Yes [provider]  vitamin B-12 (CYANOCOBALAMIN) 1000 MCG tablet Take 1,000 mcg by mouth daily.   Yes [provider]  vitamin C (ASCORBIC ACID) 500 MG tablet Take 500 mg by mouth daily.   Yes [provider]  vitamin E 400 UNIT capsule Take 400 Units by mouth daily.   Yes [provider]    ROS:  Out of a complete 14 system review of symptoms, the patient complains only of the following symptoms, and all other reviewed systems are negative.  Dizziness Fatigue  Blood pressure 105/74, pulse 82, height 5\' 4"  (1.626 m), weight 128 lb (58.1 kg).  Physical Exam  General: The patient is alert and cooperative at the time of the examination.  Skin: No significant peripheral edema is noted.   Neurologic Exam  Mental status: The patient is alert and oriented x 3 at the time of the examination. The patient has apparent normal recent and remote memory, with an apparently normal attention span and concentration ability.   Cranial nerves: Facial symmetry is present. Speech is normal, no aphasia or dysarthria is noted. Extraocular movements are full. Visual fields are full.  Motor: The patient has good strength in all 4 extremities.  Sensory examination: Soft touch sensation is symmetric on the face, arms, and legs.  Coordination: The patient has good finger-nose-finger and heel-to-shin bilaterally.  Gait and station: The patient has a normal gait. Tandem gait is unsteady. Romberg is positive, the patient falls backwards. No drift is seen.  Reflexes: Deep tendon reflexes are  symmetric.   Assessment/Plan:  1. Fibromyalgia  2. Migraine headache  3. Chronic dizziness  The dizziness has never responded to any medications. The patient will remain on clonazepam and the tizanidine that she gets through this office. The clonazepam does help her sleep at night. She will follow-up in 6 months.   Jill Alexanders MD 03/29/2017 4:31 PM  [facility] [address] [place], [place] [address]  Phone [phone] Fax [phone]

## 2017-04-22 ENCOUNTER — Other Ambulatory Visit: Payer: Self-pay | Admitting: Neurology

## 2017-04-23 NOTE — Telephone Encounter (Signed)
Faxed printed/signed rx clonazepam to [place], [place]. Fax: [phone]. Received fax confirmation.

## 2017-05-09 ENCOUNTER — Other Ambulatory Visit: Payer: Self-pay | Admitting: *Deleted

## 2017-05-09 MED ORDER — TIZANIDINE HCL 4 MG PO TABS: 4.0000 mg | ORAL_TABLET | Freq: Four times a day (QID) | ORAL | 1 refills | Status: DC

## 2017-05-21 ENCOUNTER — Other Ambulatory Visit: Payer: Self-pay | Admitting: Neurology

## 2017-05-22 NOTE — Telephone Encounter (Signed)
[facility] [place] [place] at [phone]. Received fax confirmation.

## 2017-09-26 ENCOUNTER — Emergency Department (HOSPITAL_COMMUNITY)
Admission: EM | Admit: 2017-09-26 | Discharge: 2017-09-26 | Disposition: A | Discharge status: Home / Self Care | Payer: Medicare Other | Attending: Emergency Medicine | Admitting: Emergency Medicine

## 2017-09-26 ENCOUNTER — Encounter (HOSPITAL_COMMUNITY): Payer: Self-pay

## 2017-09-26 ENCOUNTER — Emergency Department (HOSPITAL_COMMUNITY): Payer: Medicare Other

## 2017-09-26 DIAGNOSIS — R109 Unspecified abdominal pain: Secondary | ICD-10-CM | POA: Diagnosis present

## 2017-09-26 DIAGNOSIS — J329 Chronic sinusitis, unspecified: Secondary | ICD-10-CM | POA: Insufficient documentation

## 2017-09-26 DIAGNOSIS — Z79899 Other long term (current) drug therapy: Secondary | ICD-10-CM | POA: Diagnosis not present

## 2017-09-26 DIAGNOSIS — J3489 Other specified disorders of nose and nasal sinuses: Secondary | ICD-10-CM

## 2017-09-26 DIAGNOSIS — Z87891 Personal history of nicotine dependence: Secondary | ICD-10-CM | POA: Diagnosis not present

## 2017-09-26 DIAGNOSIS — R1084 Generalized abdominal pain: Secondary | ICD-10-CM | POA: Diagnosis not present

## 2017-09-26 LAB — COMPREHENSIVE METABOLIC PANEL
ALBUMIN: 4 g/dL (ref 3.5–5.0)
ALT: 16 U/L (ref 14–54)
ANION GAP: 11 (ref 5–15)
AST: 17 U/L (ref 15–41)
Alkaline Phosphatase: 49 U/L (ref 38–126)
BUN: 13 mg/dL (ref 6–20)
CALCIUM: 9.8 mg/dL (ref 8.9–10.3)
CHLORIDE: 101 mmol/L (ref 101–111)
CO2: 27 mmol/L (ref 22–32)
Creatinine, Ser: 1.17 mg/dL — ABNORMAL HIGH (ref 0.44–1.00)
GFR calc non Af Amer: 49 mL/min — ABNORMAL LOW (ref >60)
GFR, EST AFRICAN AMERICAN: 57 mL/min — AB (ref >60)
GLUCOSE: 101 mg/dL — AB (ref 65–99)
Potassium: 3.8 mmol/L (ref 3.5–5.1)
SODIUM: 139 mmol/L (ref 135–145)
Total Bilirubin: 0.8 mg/dL (ref 0.3–1.2)
Total Protein: 6.9 g/dL (ref 6.5–8.1)

## 2017-09-26 LAB — URINALYSIS, ROUTINE W REFLEX MICROSCOPIC
BILIRUBIN URINE: NEGATIVE
GLUCOSE, UA: NEGATIVE mg/dL
HGB URINE DIPSTICK: NEGATIVE
Ketones, ur: NEGATIVE mg/dL
Leukocytes, UA: NEGATIVE
Nitrite: NEGATIVE
PH: 9 — AB (ref 5.0–8.0)
Protein, ur: NEGATIVE mg/dL
Specific Gravity, Urine: 1.006 (ref 1.005–1.030)

## 2017-09-26 LAB — CBC
HEMATOCRIT: 45.1 % (ref 36.0–46.0)
HEMOGLOBIN: 15.1 g/dL — AB (ref 12.0–15.0)
MCH: 32 pg (ref 26.0–34.0)
MCHC: 33.5 g/dL (ref 30.0–36.0)
MCV: 95.6 fL (ref 78.0–100.0)
Platelets: 206 10*3/uL (ref 150–400)
RBC: 4.72 MIL/uL (ref 3.87–5.11)
RDW: 13.6 % (ref 11.5–15.5)
WBC: 8.5 10*3/uL (ref 4.0–10.5)

## 2017-09-26 LAB — LIPASE, BLOOD: LIPASE: 23 U/L (ref 11–51)

## 2017-09-26 MED ORDER — IOPAMIDOL (ISOVUE-300) INJECTION 61%
INTRAVENOUS | Status: AC
  Administered 2017-09-26: 100 mL
  Filled 2017-09-26: qty 100

## 2017-09-26 MED ORDER — LORAZEPAM 1 MG PO TABS
1.0000 mg | ORAL_TABLET | Freq: Once | ORAL | Status: AC
Start: 2017-09-26 — End: 2017-09-26
  Administered 2017-09-26: 1 mg via ORAL
  Filled 2017-09-26: qty 1

## 2017-09-26 NOTE — ED Triage Notes (Addendum)
Per Pt, Pt is coming from home with complaints of severe abdominal pain that started around CHristmas time and progressively got worse. Pt reports seeing PCP and was sent home with diagnosis of an ulcer. Pt had to stop some of the medication due to adverse reaction. Pt was unable to see Gastrologist. Denies Diarrhea or vomiting. Hx of acute Gastritis. Denies blood in stool.   Reports pressure in the posterior of her head that has been assessed by neurologist. Reports some dizziness that associated with fibromyalgia and pain with swallowing.

## 2017-09-26 NOTE — ED Notes (Signed)
Pt understood dc material. NAD Noted 

## 2017-09-26 NOTE — ED Provider Notes (Signed)
Patient placed in Quick Look pathway, seen and evaluated   Chief Complaint: Abdominal pain  HPI:  Ashlee Hooper is a 62 y.o. female  who presents to the Emergency Department complaining of progressively worsening abdominal pain since December. Seen by PCP and was told they thought she had an ulcer. Pain was initially in upper abdomen, but now occurring in LLQ as well and becoming more sever. She called her GI doctor who could not see her as quickly as she would like, therefore told her to go to ER if pain worsened.    ROS: +abdominal pain, - vomiting, fever  Physical Exam:   Gen: No distress  Neuro: Awake and Alert  Skin: Warm    Focused Exam: Diffuse, generalized tenderness without rebound or guarding.    Initiation of care has begun. The patient has been counseled on the process, plan, and necessity for staying for the completion/evaluation, and the remainder of the medical screening examination    Ashlee Hooper, Ashlee Almond, PA-C 09/26/17 1743    Hayden Rasmussen, MD 09/27/17 1130

## 2017-09-26 NOTE — ED Provider Notes (Signed)
[facility] EMERGENCY DEPARTMENT Provider Note   CSN: [id number] Arrival date & time: 09/26/17  1446     History   Chief Complaint Chief Complaint  Patient presents with  . Abdominal Pain    HPI Ashlee Hooper is a 62 y.o. female with past medical history of fibromyalgia, chronic pain syndrome, IBS, presenting to the ED with 2 weeks of generalized burning abdominal pain.  Dates pain is burning and constant, not affected by meals. She denies fever, nausea, vomiting, diarrhea or constipation, urinary symptoms, vaginal bleeding or discharge. Patient states she was seen by her PCP on the fourth, who prescribed her pantoprazole and Bentyl for her symptoms.  She states she was told she likely has an ulcer.  She states she has been taking pantoprazole as prescribed, however is unable to tolerate Bentyl due to side effects.  She states she has not scheduled an appointment with her gastroenterologist because she is trying to change providers.  Her primary care referred her to Dr. Almyra Free, however she has not scheduled an appointment because she has not been able to make it to her prior GI clinic to sign medical release forms for medical records to be transferred.   Patient also presents with second complaint of pressure sensation in her forehead and in the back of her head.  She states this is been intermittent for multiple weeks.  Denies headache, vision changes, ENT symptoms, or other complaints.  The history is provided by the patient.    Past Medical History:  Diagnosis Date  . Backache, unspecified   . Chronic fatigue   . Dizziness and giddiness 04/28/2014  . Fibromyalgia   . Headache(784.0) 04/28/2014  . Insomnia   . Menopause   . Migraine   . Mixed hyperlipidemia   . Peripheral neuropathy   . Venous insufficiency   . Vertigo     Patient Active Problem List   Diagnosis Date Noted  . Dizziness and giddiness 04/28/2014  . Headache 04/28/2014    Past Surgical  History:  Procedure Laterality Date  . CATARACT EXTRACTION  2017  . COLONOSCOPY    . HEMORRHOID SURGERY    . LAPAROSCOPY    . TONSILLECTOMY    . TOTAL ABDOMINAL HYSTERECTOMY W/ BILATERAL SALPINGOOPHORECTOMY      OB History    No data available       Home Medications    Prior to Admission medications   Medication Sig Start Date End Date Taking? Authorizing Provider  butalbital-acetaminophen-caffeine (FIORICET, ESGIC) 50-325-40 MG tablet TABLET BY MOUTH EVERY 6 HOURS AS NEEDED FOR HEADACHE. MUST LAST 20 DAYS 01/12/16   Kathrynn Ducking, MD  Cholecalciferol (VITAMIN D3) 2000 UNITS TABS Take 1 tablet by mouth daily.    [provider]  clonazePAM (KLONOPIN) 0.5 MG tablet TAKE 1 TABLET BY MOUTH EVERY MORNING AND TAKE 2 TABLETS EVERY EVENING AS NEEDED 05/22/17   Kathrynn Ducking, MD  Coenzyme Q10 (COQ10) 30 MG CAPS Take 60 mg by mouth daily.    [provider]  estradiol (VIVELLE-DOT) 0.1 MG/24HR patch Place 1 patch onto the skin 2 (two) times a week.    [provider]  L-Lysine 500 MG CAPS Take 1 capsule by mouth daily.    [provider]  MAGNESIUM PO Take 2 tablets by mouth daily.    [provider]  Omega-3 Fatty Acids (FISH OIL) 1000 MG CAPS Take 2 capsules by mouth daily.    [provider]  ondansetron (  ZOFRAN) 4 MG tablet Take 1 tablet (4 mg total) by mouth every 8 (eight) hours as needed for nausea or vomiting. 03/15/16   Kathrynn Ducking, MD  oxycodone (ROXICODONE) 30 MG immediate release tablet Take 30 mg by mouth every 8 (eight) hours as needed for pain.    [provider]  OXYCONTIN 40 MG T12A 12 hr tablet Take 40 mg by mouth daily. 04/23/14   [provider]  polyethylene glycol (MIRALAX / GLYCOLAX) packet Take 527 g by mouth daily.    [provider]  pravastatin (PRAVACHOL) 40 MG tablet Take 40 mg by mouth daily.    [provider]  SUMAtriptan (IMITREX) 20 MG/ACT nasal spray Place 20  mg into the nose every 2 (two) hours as needed for migraine or headache. May repeat in 2 hours if headache persists or recurs.    [provider]  tiZANidine (ZANAFLEX) 4 MG tablet Take 1 tablet (4 mg total) by mouth 4 (four) times daily. 05/09/17   Kathrynn Ducking, MD  triamterene-hydrochlorothiazide (MAXZIDE-25) 37.5-25 MG per tablet Take 1 tablet by mouth daily.    [provider]  valACYclovir (VALTREX) 1000 MG tablet Take 1,000 mg by mouth as needed.     [provider]  vitamin B-12 (CYANOCOBALAMIN) 1000 MCG tablet Take 1,000 mcg by mouth daily.    [provider]  vitamin C (ASCORBIC ACID) 500 MG tablet Take 500 mg by mouth daily.    [provider]  vitamin E 400 UNIT capsule Take 400 Units by mouth daily.    [provider]    Family History Family History  Problem Relation Age of Onset  . Cirrhosis Father   . Alcohol abuse Father   . Fibromyalgia Sister     Social History Social History   Tobacco Use  . Smoking status: Former Smoker    Packs/day: 1.00    Years: 30.00    Pack years: 30.00    Types: Cigarettes  . Smokeless tobacco: Never Used  . Tobacco comment: quit smoking 203  Substance Use Topics  . Alcohol use: No  . Drug use: No     Allergies   Sulfa antibiotics; Bactrim [sulfamethoxazole-trimethoprim]; Codeine sulfate; Penicillins; Wellbutrin [bupropion]; and Zoloft [sertraline hcl]   Review of Systems Review of Systems  Constitutional: Negative for chills and fever.  HENT: Positive for sinus pressure. Negative for congestion, ear pain and sore throat.   Eyes: Negative for photophobia and visual disturbance.  Gastrointestinal: Positive for abdominal pain. Negative for blood in stool, constipation, diarrhea, nausea and vomiting.  Genitourinary: Negative for dysuria, frequency, vaginal bleeding and vaginal discharge.  Neurological: Negative for light-headedness, numbness and headaches.  All other systems  reviewed and are negative.    Physical Exam Updated Vital Signs BP 127/78   Pulse 76   Temp 98.9 F (37.2 C) (Oral)   Resp 16   SpO2 98%   Physical Exam  Constitutional: She is oriented to person, place, and time. She appears well-developed and well-nourished. She does not appear ill. No distress.  HENT:  Head: Normocephalic and atraumatic.  Right Ear: External ear and ear canal normal.  Left Ear: External ear and ear canal normal.  Nose: Right sinus exhibits maxillary sinus tenderness and frontal sinus tenderness. Left sinus exhibits maxillary sinus tenderness and frontal sinus tenderness.  Mouth/Throat: Uvula is midline, oropharynx is clear and moist and mucous membranes are normal. No trismus in the jaw. No uvula swelling.  Unable to visualize  TMs bilaterally secondary to cerumen  Eyes: Conjunctivae and EOM are normal. Pupils are equal, round, and reactive to light.  Neck: Normal range of motion. Neck supple. No JVD present. No thyromegaly present.  Cardiovascular: Normal rate, regular rhythm, normal heart sounds and intact distal pulses.  Pulmonary/Chest: Effort normal and breath sounds normal. No respiratory distress.  Abdominal: Soft. Normal appearance and bowel sounds are normal. She exhibits no distension and no mass. There is generalized tenderness. There is no rigidity, no rebound, no guarding and negative Murphy's sign.  Lymphadenopathy:    She has no cervical adenopathy.  Neurological: She is alert and oriented to person, place, and time. No cranial nerve deficit or sensory deficit.  Skin: Skin is warm.  Psychiatric: She has a normal mood and affect. Her behavior is normal.  Nursing note and vitals reviewed.    ED Treatments / Results  Labs (all labs ordered are listed, but only abnormal results are displayed) Labs Reviewed  COMPREHENSIVE METABOLIC PANEL - Abnormal; Notable for the following components:      Result Value   Glucose, Bld 101 (*)    Creatinine, Ser  1.17 (*)    GFR calc non Af Amer 49 (*)    GFR calc Af Amer 57 (*)    All other components within normal limits  CBC - Abnormal; Notable for the following components:   Hemoglobin 15.1 (*)    All other components within normal limits  URINALYSIS, ROUTINE W REFLEX MICROSCOPIC - Abnormal; Notable for the following components:   Color, Urine STRAW (*)    pH 9.0 (*)    All other components within normal limits  LIPASE, BLOOD    EKG  EKG Interpretation None       Radiology Ct Abdomen Pelvis W Contrast  Result Date: 09/26/2017 CLINICAL DATA:  62 year old female complaining of abdominal pain and cramping, increasing in severity since December 2018. EXAM: CT ABDOMEN AND PELVIS WITH CONTRAST TECHNIQUE: Multidetector CT imaging of the abdomen and pelvis was performed using the standard protocol following bolus administration of intravenous contrast. CONTRAST:  13mL ISOVUE-300 IOPAMIDOL (ISOVUE-300) INJECTION 61% COMPARISON:  No priors. FINDINGS: Lower chest: Unremarkable. Hepatobiliary: No cystic or solid hepatic lesions. No intra or extrahepatic biliary ductal dilatation. Gallbladder is normal in appearance. Pancreas: No pancreatic mass. No pancreatic ductal dilatation. No pancreatic or peripancreatic fluid or inflammatory changes. Spleen: Unremarkable. Adrenals/Urinary Tract: 9 mm simple cyst in the lower pole of the right kidney. Subcentimeter low-attenuation lesion in the lower pole the left kidney, too small to characterize, but statistically likely to represent a cyst. No hydroureteronephrosis. Urinary bladder is normal in appearance. Bilateral adrenal glands are normal in appearance. Stomach/Bowel: Normal appearance of the stomach. No pathologic dilatation of small bowel or colon. Multiple pill shaped densities throughout the small bowel and colon, presumably ingested pills. The appendix is not confidently identified and may be surgically absent. Regardless, there are no inflammatory changes  noted adjacent to the cecum to suggest the presence of an acute appendicitis at this time. Vascular/Lymphatic: Aortic atherosclerosis, without evidence of aneurysm or dissection in the abdominal or pelvic vasculature. No lymphadenopathy noted in the abdomen or pelvis. Reproductive: Status post hysterectomy. Ovaries are not confidently identified may be surgically absent or atrophic. Other: No significant volume of ascites.  No pneumoperitoneum. Musculoskeletal: There are no aggressive appearing lytic or blastic lesions noted in the visualized portions of the skeleton. IMPRESSION: 1. No acute findings are noted in the abdomen or pelvis to account for the  patient's symptoms. 2. Aortic atherosclerosis. 3. Additional incidental findings, as above. Electronically Signed   By: Vinnie Langton M.D.   On: 09/26/2017 19:26    Procedures Procedures (including critical care time)  Medications Ordered in ED Medications  LORazepam (ATIVAN) tablet 1 mg (not administered)  iopamidol (ISOVUE-300) 61 % injection (100 mLs  Contrast Given 09/26/17 1858)     Initial Impression / Assessment and Plan / ED Course  I have reviewed the triage vital signs and the nursing notes.  Pertinent labs & imaging results that were available during my care of the patient were reviewed by me and considered in my medical decision making (see chart for details).     Pt w generalized abdominal pain x2 weeks. Patient is nontoxic, nonseptic appearing, in no apparent distress.  Labs, imaging and vitals reviewed.  No leukocytosis.  Urine negative for infection.  CT scan ordered in triage negative for acute abdominal pathology.  Patient does not meet the SIRS or Sepsis criteria.  On repeat exam patient does not have a surgical abdomen and there are no peritoneal signs.  No indication of appendicitis, bowel obstruction, bowel perforation, cholecystitis, diverticulitis.  Regarding patient's complaint of facial and head pressure, patient with  bilateral sinus tenderness however denies nasal congestion.  No neuro deficits.  Blood pressure normal.  Recommend symptomatic management with saline nasal spray and OTC allergy medications for sinus pressure.  Recommend patient follow-up with gastroenterology for further evaluation of ongoing abdominal complaints; Suspect symptoms may be secondary to IBS.  Patient discharged home with symptomatic treatment and given strict instructions for follow-up with their primary care physician.  Pt safe for discharge.  Patient discussed with and seen by Dr. Marcha Dutton.  Discussed results, findings, treatment and follow up. Patient advised of return precautions. Patient verbalized understanding and agreed with plan.   Final Clinical Impressions(s) / ED Diagnoses   Final diagnoses:  Generalized abdominal pain  Sinus pressure    ED Discharge Orders    None       Kenzly Rogoff, [place] N, PA-C 09/26/17 2139    Pixie Casino, MD 09/26/17 2149

## 2017-09-26 NOTE — Discharge Instructions (Signed)
Please schedule an appointment with your gastroenterologist, or use the referral, for further management of your ongoing abdominal pain. Continue taking pantoprazole. Avoid advil/ibuprofen/motrin, aleve, goody's. Avoid spicy foods, greasy foods, acidic foods, as all of these things can be rough on your stomach. You can use saline nasal spray for your sinus pressure. You can also take an allergy medication. Follow up with your primary care provider if symptoms persist.

## 2017-09-28 ENCOUNTER — Telehealth: Payer: Self-pay | Admitting: Neurology

## 2017-09-28 MED ORDER — CLONAZEPAM 0.5 MG PO TABS: ORAL_TABLET | ORAL | 5 refills | Status: DC

## 2017-09-28 NOTE — Telephone Encounter (Signed)
Pt was seen in the ED for gastro problems on 2/13. She was given an ativan to help with anxiety and pain. She f/u with MD but he did not want to give RX for ativan since Dr Jannifer Franklin prescribes clonazePAM (KLONOPIN) 0.5 MG tablet . 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 Please call to advise. Pt is aware the clinic closes at noon today.

## 2017-09-28 NOTE — Progress Notes (Signed)
Rx fax to  Pharmacy at [phone].

## 2017-09-28 NOTE — Telephone Encounter (Signed)
I called the patient.  Patient's had increased symptoms with her irritable bowel, she has pressure in the head, she went to the emergency room on 13 February and was told that she had increased stress, they gave her some Ativan, I indicated that she is already on clonazepam.  We will increase the clonazepam slightly taking 0.5 mg tablets, 1 in the morning and 3 in the evening.  The patient was given Bentyl for her irritable bowel, she indicates that she cannot tolerate this medication.

## 2017-10-04 ENCOUNTER — Encounter: Payer: Self-pay | Admitting: Physician Assistant

## 2017-10-08 ENCOUNTER — Encounter: Payer: Self-pay | Admitting: Neurology

## 2017-10-08 ENCOUNTER — Ambulatory Visit: Payer: Medicare Other | Admitting: Neurology

## 2017-10-08 VITALS — BP 111/69 | HR 76 | Ht 64.0 in | Wt 126.0 lb

## 2017-10-08 DIAGNOSIS — R42 Dizziness and giddiness: Secondary | ICD-10-CM | POA: Diagnosis not present

## 2017-10-08 DIAGNOSIS — G441 Vascular headache, not elsewhere classified: Secondary | ICD-10-CM | POA: Diagnosis not present

## 2017-10-08 MED ORDER — LAMOTRIGINE 25 MG PO TABS: 50.0000 mg | ORAL_TABLET | Freq: Two times a day (BID) | ORAL | 3 refills | Status: DC

## 2017-10-08 NOTE — Progress Notes (Signed)
Reason for visit: Headache, dizziness  Ashlee Hooper is an 62 y.o. female  History of present illness:  Ashlee Hooper is a 62 year old right-handed white female with a history of underlying anxiety and depression.  The patient has been under a lot of stress recently, her son has apparently divorced and has come to live with them, he has lost his job and he has a lot of underlying psychiatric issues himself.  This is put a lot of stress on the patient, she has had abdominal pain associated with this, she will be seeing gastroenterology in the near future.  The patient has had a slight increase in her clonazepam dose taking 4 of the 0.5 mg clonazepam tablets daily.  This she claims this has not helped her.  The patient wishes to go up on the dose of the clonazepam.  The patient has had ongoing headaches and pressure sensations, she has the sensation of dizziness and rocking, she has some gait instability.  She has lost some weight, 5 pounds over 1 month.  She returns to this office today for an evaluation.  Past Medical History:  Diagnosis Date  . Backache, unspecified   . Chronic fatigue   . Dizziness and giddiness 04/28/2014  . Fibromyalgia   . Headache(784.0) 04/28/2014  . Insomnia   . Menopause   . Migraine   . Mixed hyperlipidemia   . Peripheral neuropathy   . Venous insufficiency   . Vertigo     Past Surgical History:  Procedure Laterality Date  . CATARACT EXTRACTION  2017  . COLONOSCOPY    . HEMORRHOID SURGERY    . LAPAROSCOPY    . TONSILLECTOMY    . TOTAL ABDOMINAL HYSTERECTOMY W/ BILATERAL SALPINGOOPHORECTOMY      Family History  Problem Relation Age of Onset  . Cirrhosis Father   . Alcohol abuse Father   . Fibromyalgia Sister     Social history:  reports that she has quit smoking. Her smoking use included cigarettes. She has a 30.00 pack-year smoking history. she has never used smokeless tobacco. She reports that she does not drink alcohol or use drugs.      Allergies  Allergen Reactions  . Sulfa Antibiotics Nausea And Vomiting  . Bactrim [Sulfamethoxazole-Trimethoprim]   . Codeine Sulfate   . Penicillins   . Wellbutrin [Bupropion] Other (See Comments)  . Zoloft [Sertraline Hcl] Other (See Comments)    Medications:  Prior to Admission medications   Medication Sig Start Date End Date Taking? Authorizing Provider  butalbital-acetaminophen-caffeine (FIORICET, ESGIC) 50-325-40 MG tablet TABLET BY MOUTH EVERY 6 HOURS AS NEEDED FOR HEADACHE. MUST LAST 20 DAYS 01/12/16  Yes Kathrynn Ducking, MD  Cholecalciferol (VITAMIN D3) 2000 UNITS TABS Take 1 tablet by mouth daily.   Yes [provider]  clonazePAM (KLONOPIN) 0.5 MG tablet Take 1 tablet in the morning, 3 in the evening 09/28/17  Yes Kathrynn Ducking, MD  Coenzyme Q10 (COQ10) 30 MG CAPS Take 60 mg by mouth daily.   Yes [provider]  estradiol (VIVELLE-DOT) 0.1 MG/24HR patch Place 1 patch onto the skin 2 (two) times a week.   Yes [provider]  L-Lysine 500 MG CAPS Take 1 capsule by mouth daily.   Yes [provider]  MAGNESIUM PO Take 2 tablets by mouth daily.   Yes [provider]  Omega-3 Fatty Acids (FISH OIL) 1000 MG CAPS Take 2 capsules by mouth daily.   Yes [provider]  ondansetron (  ZOFRAN) 4 MG tablet Take 1 tablet (4 mg total) by mouth every 8 (eight) hours as needed for nausea or vomiting. 03/15/16  Yes Kathrynn Ducking, MD  oxycodone (ROXICODONE) 30 MG immediate release tablet Take 30 mg by mouth every 8 (eight) hours as needed for pain.   Yes [provider]  OXYCONTIN 40 MG T12A 12 hr tablet Take 40 mg by mouth daily. 04/23/14  Yes [provider]  pantoprazole (PROTONIX) 40 MG tablet Take 40 mg by mouth daily. 09/17/17  Yes [provider]  polyethylene glycol (MIRALAX / GLYCOLAX) packet Take 527 g by mouth daily.   Yes [provider]  pravastatin (PRAVACHOL) 40 MG tablet Take 40 mg by  mouth daily.   Yes [provider]  SUMAtriptan (IMITREX) 20 MG/ACT nasal spray Place 20 mg into the nose every 2 (two) hours as needed for migraine or headache. May repeat in 2 hours if headache persists or recurs.   Yes [provider]  tiZANidine (ZANAFLEX) 4 MG tablet Take 1 tablet (4 mg total) by mouth 4 (four) times daily. 05/09/17  Yes Kathrynn Ducking, MD  triamterene-hydrochlorothiazide (MAXZIDE-25) 37.5-25 MG per tablet Take 1 tablet by mouth daily.   Yes [provider]  valACYclovir (VALTREX) 1000 MG tablet Take 1,000 mg by mouth as needed.    Yes [provider]  vitamin B-12 (CYANOCOBALAMIN) 1000 MCG tablet Take 1,000 mcg by mouth daily.   Yes [provider]  vitamin C (ASCORBIC ACID) 500 MG tablet Take 500 mg by mouth daily.   Yes [provider]  vitamin E 400 UNIT capsule Take 400 Units by mouth daily.   Yes [provider]  lamoTRIgine (LAMICTAL) 25 MG tablet Take 2 tablets (50 mg total) by mouth 2 (two) times daily. 10/08/17   Kathrynn Ducking, MD    ROS:  Out of a complete 14 system review of symptoms, the patient complains only of the following symptoms, and all other reviewed systems are negative.  Decreased appetite, weight loss Difficulty swallowing Cold intolerance, heat intolerance Swollen abdomen, abdominal pain, nausea Insomnia, frequent waking Food allergies Dizziness, numbness Itching  Blood pressure 111/69, pulse 76, height 5\' 4"  (1.626 m), weight 126 lb (57.2 kg).  Physical Exam  General: The patient is alert and cooperative at the time of the examination.  Skin: No significant peripheral edema is noted.   Neurologic Exam  Mental status: The patient is alert and oriented x 3 at the time of the examination. The patient has apparent normal recent and remote memory, with an apparently normal attention span and concentration ability.   Cranial nerves: Facial symmetry is present. Speech is  normal, no aphasia or dysarthria is noted. Extraocular movements are full. Visual fields are full.  Motor: The patient has good strength in all 4 extremities.  Sensory examination: Soft touch sensation is symmetric on the face, arms, and legs.  Coordination: The patient has good finger-nose-finger and heel-to-shin bilaterally.  Gait and station: The patient has a slightly wide-based, unsteady gait. Tandem gait is unsteady. Romberg is positive, the patient falls backwards. No drift is seen.  Reflexes: Deep tendon reflexes are symmetric.   Assessment/Plan:  1.  Chronic daily headache  2.  Chronic dizziness  3.  Chronic anxiety  I have recommended the patient see a psychologist or a counselor to help her with her underlying stress.  The patient wishes to go up on the clonazepam dose, I do not wish to do  this.  The patient will be placed on Lamictal taking 25 mg twice daily for 2 weeks and go to 50 mg twice daily.  The patient will follow-up in 6 months.  The patient desperately needs to have some sort of psychological care through a professional.  Jill Alexanders MD 10/08/2017 4:40 PM  [facility] [address] [place], [place] [address]  Phone [phone] Fax [phone]

## 2017-10-08 NOTE — Patient Instructions (Signed)
   With the lamictal 25 mg, take one twice a day for 2 weeks, then take 2 tablets twice a day.  Lamictal (lamotrigine) is a seizure medication that occasionally may be used for other purposes such as peripheral neuropathy pain or certain types of headache. This medication is relatively safe, but occasionally side effects can occur. A skin rash may occur when first starting the medication. As with any seizure medication, depression may worsen on the drug. Other potential side effects include dizziness, headache, drowsiness or insomnia, decreased concentration, or stomach upset. This medication may also be used as a mood stabilizer. If you believe that you are having side effects on the medication, please contact our office.

## 2017-10-31 ENCOUNTER — Encounter: Payer: Self-pay | Admitting: Physician Assistant

## 2017-10-31 ENCOUNTER — Ambulatory Visit: Payer: Medicare Other | Admitting: Physician Assistant

## 2017-10-31 VITALS — BP 126/88 | HR 70 | Ht 64.0 in | Wt 123.1 lb

## 2017-10-31 DIAGNOSIS — K582 Mixed irritable bowel syndrome: Secondary | ICD-10-CM | POA: Diagnosis not present

## 2017-10-31 DIAGNOSIS — R140 Abdominal distension (gaseous): Secondary | ICD-10-CM

## 2017-10-31 DIAGNOSIS — R109 Unspecified abdominal pain: Secondary | ICD-10-CM

## 2017-10-31 MED ORDER — RIFAXIMIN 550 MG PO TABS: 550.0000 mg | ORAL_TABLET | Freq: Three times a day (TID) | ORAL | 0 refills | Status: DC

## 2017-10-31 NOTE — Patient Instructions (Addendum)
We are sending a Prescription for Xifaxan 550 mg to [facility], [place].  We will send them your insurance coverage information. They will call you with insurance coverage cost.  If this is too expensive after they run your insurance call us and Amy will consider an alternative medication.    You can also get IBGARD at your pharmacy, which is over the counter. Take 1 cap twice daily. Continue Miralax 17 grams daily.  The number for [facility] is: [phone].  IIf you are age 40 or younger, your body mass index should be between 19-25. Your Body mass index is 21.13 kg/m. If this is out of the aformentioned range listed, please consider follow up with your Primary Care Provider.

## 2017-10-31 NOTE — Progress Notes (Signed)
Subjective:    Patient ID: Ashlee Hooper, female    DOB: 1956-03-27, 62 y.o.   MRN: [id number]  HPI Ashlee Hooper is a 62 year old white female, new to GI today referred by [facility] after visit there 09/26/2017. Her PCP is Dr. Ashby Dawes, and she had had previous remote GI evaluation per Dr. Earlean Shawl. She was last seen there in 2010, has history of internal hemorrhoids and IBS. Colonoscopy Was done in 2002 revealing grade 2 internal hemorrhoids and was otherwise negative. Patient has history of fibromyalgia, opioid dependence secondary to chronic pain, chronic fatigue anxiety and IBS. She says her current symptoms started in December 2018, and says she has been under a tremendous amount of stress recently.  She and her husband recently had her adult son moved back in with them and he had related a lot of very disturbing information about his biologic father.  She is very tearful when trying to talk about this. She feels that her IBS is been much worse since that time, and knows that she is very anxious but does not feel she is depressed.  She does have a prescription for clonazepam which she has been taking regularly but has not been helping her current symptoms.  She says this is a chronic prescription. With her recent complaints of abdominal pain she was started on a trial of dicyclomine by her PCP which has made her more constipated and has not helped her pain.  She was also given a trial of Protonix which is not helping.  She says she has no chronic problems with heartburn or indigestion. She has been taking MiraLAX to help manage constipation.  Her primary complaint today is a feeling of bloating distention and gas which she says she cannot expel.  This seems to get worse later in the day.  She describes mid and lower abdominal pain and cramping ongoing. She tends to follow a very strict diet and says she has a lot of dietary intolerances.  I mentioned colonoscopy and she says she is  not sure she will ever have another colonoscopy.  She is in the process of doing a Colo guard screening currently. She had ER visit 09/26/2017 due to the above symptoms.  CT scan of the abdomen and pelvis was done which was negative with the exception of aortic atherosclerosis.  Baseline labs including CBC chemistries and lipase all unremarkable.  Review of Systems Pertinent positive and negative review of systems were noted in the above HPI section.  All other review of systems was otherwise negative.  Outpatient Encounter Medications as of 10/31/2017  Medication Sig  . butalbital-acetaminophen-caffeine (FIORICET, ESGIC) 50-325-40 MG tablet TABLET BY MOUTH EVERY 6 HOURS AS NEEDED FOR HEADACHE. MUST LAST 20 DAYS  . Cholecalciferol (VITAMIN D3) 2000 UNITS TABS Take 1 tablet by mouth daily.  . clonazePAM (KLONOPIN) 0.5 MG tablet Take 1 tablet in the morning, 3 in the evening  . Coenzyme Q10 (COQ10) 30 MG CAPS Take 60 mg by mouth daily.  Marland Kitchen estradiol (VIVELLE-DOT) 0.1 MG/24HR patch Place 1 patch onto the skin 2 (two) times a week.  . L-Lysine 500 MG CAPS Take 1 capsule by mouth daily.  Marland Kitchen lamoTRIgine (LAMICTAL) 25 MG tablet Take 2 tablets (50 mg total) by mouth 2 (two) times daily.  Marland Kitchen MAGNESIUM PO Take 2 tablets by mouth daily.  . Omega-3 Fatty Acids (FISH OIL) 1000 MG CAPS Take 2 capsules by mouth daily.  . ondansetron (ZOFRAN) 4 MG tablet Take 1  tablet (4 mg total) by mouth every 8 (eight) hours as needed for nausea or vomiting.  Marland Kitchen oxycodone (ROXICODONE) 30 MG immediate release tablet Take 30 mg by mouth every 8 (eight) hours as needed for pain.  . OXYCONTIN 40 MG T12A 12 hr tablet Take 40 mg by mouth daily.  . pantoprazole (PROTONIX) 40 MG tablet Take 40 mg by mouth daily.  . polyethylene glycol (MIRALAX / GLYCOLAX) packet Take 527 g by mouth daily.  . pravastatin (PRAVACHOL) 40 MG tablet Take 40 mg by mouth daily.  . SUMAtriptan (IMITREX) 20 MG/ACT nasal spray Place 20 mg into the nose every 2  (two) hours as needed for migraine or headache. May repeat in 2 hours if headache persists or recurs.  Marland Kitchen tiZANidine (ZANAFLEX) 4 MG tablet Take 1 tablet (4 mg total) by mouth 4 (four) times daily.  Marland Kitchen triamterene-hydrochlorothiazide (MAXZIDE-25) 37.5-25 MG per tablet Take 1 tablet by mouth daily.  . valACYclovir (VALTREX) 1000 MG tablet Take 1,000 mg by mouth as needed.   . vitamin B-12 (CYANOCOBALAMIN) 1000 MCG tablet Take 1,000 mcg by mouth daily.  . vitamin C (ASCORBIC ACID) 500 MG tablet Take 500 mg by mouth daily.  . vitamin E 400 UNIT capsule Take 400 Units by mouth daily.  . rifaximin (XIFAXAN) 550 MG TABS tablet Take 1 tablet (550 mg total) by mouth 3 (three) times daily.   No facility-administered encounter medications on file as of 10/31/2017.    Allergies  Allergen Reactions  . Sulfa Antibiotics Nausea And Vomiting  . Bactrim [Sulfamethoxazole-Trimethoprim]   . Codeine Sulfate   . Penicillins   . Wellbutrin [Bupropion] Other (See Comments)  . Zoloft [Sertraline Hcl] Other (See Comments)   Patient Active Problem List   Diagnosis Date Noted  . Dizziness and giddiness 04/28/2014  . Headache 04/28/2014   Social History   Socioeconomic History  . Marital status: Married    Spouse name: Not on file  . Number of children: 1  . Years of education: BA  . Highest education level: Not on file  Social Needs  . Financial resource strain: Not on file  . Food insecurity - worry: Not on file  . Food insecurity - inability: Not on file  . Transportation needs - medical: Not on file  . Transportation needs - non-medical: Not on file  Occupational History  . Occupation: disabled  . Occupation: Pharmacist, hospital  Tobacco Use  . Smoking status: Former Smoker    Packs/day: 1.00    Years: 30.00    Pack years: 30.00    Types: Cigarettes  . Smokeless tobacco: Never Used  . Tobacco comment: quit smoking 203  Substance and Sexual Activity  . Alcohol use: No  . Drug use: No  . Sexual  activity: Not on file  Other Topics Concern  . Not on file  Social History Narrative   Patient drinks 1/2 cup of caffeine daily.   Patient is right handed.    Ashlee Hooper family history includes Alcohol abuse in her father; Cirrhosis in her father; Fibromyalgia in her sister.      Objective:    Vitals:   10/31/17 1449  BP: 126/88  Pulse: 70    Physical Exam; well-developed white female, in no acute distress, anxious accompanied by her husband blood pressure 126/88, pulse 70, height 5 foot 4, weight 123, BMI 21.1.  HEENT ;nontraumatic normocephalic EOMI PERRLA sclerae anicteric, Cardiovascular; regular rate and rhythm with S1-S2 no murmur rub or gallop, Pulmonary clear bilaterally,  Abdomen ;soft, bowel sounds are present, there is no focal tenderness today no guarding or rebound no palpable mass or hepatosplenomegaly, Rectal ;exam not done, Extremities; no clubbing cyanosis or edema skin warm and dry, Neuropsych;.  Patient appears anxious and was tearful at times.       Assessment & Plan:   #29 62 year old white female with history of IBS with acute worsening of symptoms over the past 3 months in the setting of significant underlying anxiety and stress I believe most of her symptoms are stress related. With complaints of ongoing gas and bloating consider bacterial overgrowth.  #2; constipation-likely medication induced with dicyclomine #3 fibromyalgia #4.  Chronic pain syndrome with opioid dependence #5.  Chronic fatigue syndrome #6.  Colon cancer screening-patient is currently doing a Cologuard through her PCP  Plan; stop dicyclomine Stop Protonix We will treat her with a course of Xifaxan 550 mg p.o. 3 times daily times 14 days Also advised her to start over-the-counter IB Gard twice daily Low gas diet We discussed association of exacerbation of IBS with stress and anxiety. She amenable to counseling and is referred to [facility] behavioral health ,which I think will be very  beneficial. She is asked to call in 3-4 weeks after she completes the Xifaxan, if symptoms have not significantly improved. Patient will be established with Dr. Henrene Pastor.      Ayah Cozzolino S Odean Mcelwain PA-C 10/31/2017   Cc: Merrilee Seashore, MD

## 2017-11-01 NOTE — Progress Notes (Signed)
Assessment and plans reviewed  

## 2017-11-03 ENCOUNTER — Telehealth: Payer: Self-pay | Admitting: Gastroenterology

## 2017-11-03 MED ORDER — HYOSCYAMINE SULFATE 0.125 MG SL SUBL: 0.1250 mg | SUBLINGUAL_TABLET | Freq: Three times a day (TID) | SUBLINGUAL | 0 refills | Status: DC | PRN

## 2017-11-03 NOTE — Telephone Encounter (Signed)
Patient called complaining of severe abdominal pain and bloating.  Was seen by Amy on 3/20, please see that note regarding her evaluation thus far.  Was prescribed Xifaxan and she received a call late last night saying the medication was not covered by her insurance.  She is moving her bowels.  Her pantoprazole was discontinued at her OV but she has been having a lot of heartburn/reflux so has restarted it back again.  Has been taking the IBgard.  I advised her that we can look into the xifaxan on Monday to see if there is any appeal, etc that can be done.  Until then will take a clear or no more than a full liquid diet.  Continue IBgard.  Her dicyclomine was discontinued because it was thought to be worsening her constipation, but we will try some levsin 01.25 mg just to get her through the weekend.  She will call the office back on Monday and will go to the ED if pain continues to be severe and she feels she cannot wait until Monday.

## 2017-11-06 NOTE — Telephone Encounter (Signed)
I left a message to call us back with a progress report.

## 2017-11-07 ENCOUNTER — Other Ambulatory Visit: Payer: Self-pay

## 2017-11-07 MED ORDER — RIFAXIMIN 550 MG PO TABS: 550.0000 mg | ORAL_TABLET | Freq: Three times a day (TID) | ORAL | 0 refills | Status: DC

## 2017-11-07 NOTE — Telephone Encounter (Signed)
Spoke with the patient. She states everything is the same. Everything is unchanged. "If something doesn't get better, I will go to [facility]." "That medicine that is supposed to be an antispasmodic don't do nothing."  Further discussion, pain below her navel and sometimes above. "Can't eat nothing cause everything hurts me." No bowel movement "since Monday" but again with further questioning, she does pass soft unformed stool.  Samples of Xifanxin for the full course have been obtained. She will send a family member to the office to pick them up for her.

## 2017-11-08 NOTE — Telephone Encounter (Signed)
Ok - will treat her with Xifaxan as next step- if she wants to seek care elsewhere that is fine also.

## 2017-11-19 ENCOUNTER — Telehealth: Payer: Self-pay | Admitting: Physician Assistant

## 2017-11-19 NOTE — Telephone Encounter (Signed)
Patient complains of constipation. States she has not had a bowel movement in 2 weeks. States she is taking 6 doses of Miralax daily for the past 2 weeks. She can only pass gas on the commode. Complains of abdominal pain.

## 2017-11-22 NOTE — Telephone Encounter (Signed)
Can we get her a suprep  Or other sample bowel prep to purge her  Bowel- please

## 2017-11-22 NOTE — Telephone Encounter (Signed)
Clenpiq sample at the front desk. Left a message for the patient

## 2018-01-21 ENCOUNTER — Other Ambulatory Visit: Payer: Self-pay | Admitting: Neurology

## 2018-03-20 ENCOUNTER — Other Ambulatory Visit: Payer: Self-pay | Admitting: Obstetrics & Gynecology

## 2018-03-20 DIAGNOSIS — Z1231 Encounter for screening mammogram for malignant neoplasm of breast: Secondary | ICD-10-CM

## 2018-04-16 NOTE — Progress Notes (Signed)
[facility]  PATIENT: Ashlee Hooper DOB: 1956/06/24   REASON FOR VISIT: Follow-up for headache and dizziness HISTORY FROM: Patient   HISTORY OF PRESENT ILLNESS:UPDATE 9/4/2019CM Ashlee Hooper, 62 year old female returns for follow-up with history of migraine headaches dizziness and anxiety depression.  She was placed on Lamictal when  last seen by Dr. Jannifer Franklin however she is no longer taking that medication, she was switched to tizanidine which is been beneficial for her headaches.  She tells me she cannot remember when she had her last migraine.  She is seeing psychiatry now for her significant depression and anxiety  She is on polypharmacy for her chronic pain.  She has some gait instability but has not fallen.  She returns for reevaluation  HISTORY: Ashlee Hooper is a 62 year old right-handed white female with a history of underlying anxiety and depression.  The patient has been under a lot of stress recently, her son has apparently divorced and has come to live with them, he has lost his job and he has a lot of underlying psychiatric issues himself.  This is put a lot of stress on the patient, she has had abdominal pain associated with this, she will be seeing gastroenterology in the near future.  The patient has had a slight increase in her clonazepam dose taking 4 of the 0.5 mg clonazepam tablets daily.  This she claims this has not helped her.  The patient wishes to go up on the dose of the clonazepam.  The patient has had ongoing headaches and pressure sensations, she has the sensation of dizziness and rocking, she has some gait instability.  She has lost some weight, 5 pounds over 1 month.  She returns to this office today for an evaluation.  REVIEW OF SYSTEMS: Full 14 system review of systems performed and notable only for those listed, all others are neg:  Constitutional: neg  Cardiovascular: neg Ear/Nose/Throat: neg  Skin: neg Eyes: neg Respiratory:  neg Gastroitestinal: neg  Hematology/Lymphatic: neg  Endocrine: neg Musculoskeletal: Chronic pain on polypharmacy Allergy/Immunology: neg Neurological: History of headaches, dizziness Psychiatric: Depression and anxiety Sleep : neg   ALLERGIES: Allergies  Allergen Reactions  . Sulfa Antibiotics Nausea And Vomiting  . Bactrim [Sulfamethoxazole-Trimethoprim]   . Codeine Sulfate   . Penicillins   . Wellbutrin [Bupropion] Other (See Comments)  . Zoloft [Sertraline Hcl] Other (See Comments)    HOME MEDICATIONS: Outpatient Medications Prior to Visit  Medication Sig Dispense Refill  . butalbital-acetaminophen-caffeine (FIORICET, ESGIC) 50-325-40 MG tablet TABLET BY MOUTH EVERY 6 HOURS AS NEEDED FOR HEADACHE. MUST LAST 20 DAYS 30 tablet 1  . Cholecalciferol (VITAMIN D3) 2000 UNITS TABS Take 1 tablet by mouth daily.    . Coenzyme Q10 (COQ10) 30 MG CAPS Take 60 mg by mouth daily.    Marland Kitchen estradiol (VIVELLE-DOT) 0.1 MG/24HR patch Place 1 patch onto the skin 2 (two) times a week.    . L-Lysine 500 MG CAPS Take 1 capsule by mouth daily.    Marland Kitchen LORazepam (ATIVAN) 1 MG tablet Take 1 mg by mouth every 8 (eight) hours. 1 in the am 3 before bedtime    . MAGNESIUM PO Take 2 tablets by mouth daily.    . Omega-3 Fatty Acids (FISH OIL) 1000 MG CAPS Take 2 capsules by mouth daily.    . ondansetron (ZOFRAN) 4 MG tablet Take 1 tablet (4 mg total) by mouth every 8 (eight) hours as needed for nausea or vomiting. 20 tablet 1  . oxycodone (ROXICODONE)  30 MG immediate release tablet Take 30 mg by mouth every 8 (eight) hours as needed for pain.    . OXYCONTIN 40 MG T12A 12 hr tablet Take 40 mg by mouth daily.    . pantoprazole (PROTONIX) 40 MG tablet Take 40 mg by mouth daily.  0  . polyethylene glycol (MIRALAX / GLYCOLAX) packet Take 527 g by mouth daily.    . pravastatin (PRAVACHOL) 40 MG tablet Take 40 mg by mouth daily.    . SUMAtriptan (IMITREX) 20 MG/ACT nasal spray Place 20 mg into the nose every 2 (two)  hours as needed for migraine or headache. May repeat in 2 hours if headache persists or recurs.    Marland Kitchen tiZANidine (ZANAFLEX) 4 MG tablet TAKE 1 TABLET BY MOUTH 4 TIMES A DAY AS NEEDED 360 tablet 0  . triamterene-hydrochlorothiazide (MAXZIDE-25) 37.5-25 MG per tablet Take 1 tablet by mouth daily.    . valACYclovir (VALTREX) 1000 MG tablet Take 1,000 mg by mouth as needed.     . vitamin B-12 (CYANOCOBALAMIN) 1000 MCG tablet Take 1,000 mcg by mouth daily.    . vitamin C (ASCORBIC ACID) 500 MG tablet Take 500 mg by mouth daily.    . vitamin E 400 UNIT capsule Take 400 Units by mouth daily.    . clonazePAM (KLONOPIN) 0.5 MG tablet Take 1 tablet in the morning, 3 in the evening 120 tablet 5  . hyoscyamine (LEVSIN SL) 0.125 MG SL tablet Place 1 tablet (0.125 mg total) under the tongue every 8 (eight) hours as needed. 15 tablet 0  . lamoTRIgine (LAMICTAL) 25 MG tablet Take 2 tablets (50 mg total) by mouth 2 (two) times daily. 120 tablet 3  . rifaximin (XIFAXAN) 550 MG TABS tablet Take 1 tablet (550 mg total) by mouth 3 (three) times daily. 42 tablet 0   No facility-administered medications prior to visit.     PAST MEDICAL HISTORY: Past Medical History:  Diagnosis Date  . Backache, unspecified   . Chronic fatigue   . Dizziness and giddiness 04/28/2014  . Fibromyalgia   . Headache(784.0) 04/28/2014  . Insomnia   . Menopause   . Migraine   . Mixed hyperlipidemia   . Peripheral neuropathy   . Venous insufficiency   . Vertigo     PAST SURGICAL HISTORY: Past Surgical History:  Procedure Laterality Date  . CATARACT EXTRACTION  2017  . COLONOSCOPY    . HEMORRHOID SURGERY    . LAPAROSCOPY    . TONSILLECTOMY    . TOTAL ABDOMINAL HYSTERECTOMY W/ BILATERAL SALPINGOOPHORECTOMY      FAMILY HISTORY: Family History  Problem Relation Age of Onset  . Cirrhosis Father   . Alcohol abuse Father   . Fibromyalgia Sister     SOCIAL HISTORY: Social History   Socioeconomic History  . Marital status:  Married    Spouse name: Not on file  . Number of children: 1  . Years of education: BA  . Highest education level: Not on file  Occupational History  . Occupation: disabled  . Occupation: Pharmacist, hospital  Social Needs  . Financial resource strain: Not on file  . Food insecurity:    Worry: Not on file    Inability: Not on file  . Transportation needs:    Medical: Not on file    Non-medical: Not on file  Tobacco Use  . Smoking status: Former Smoker    Packs/day: 1.00    Years: 30.00    Pack years: 30.00  Types: Cigarettes  . Smokeless tobacco: Never Used  . Tobacco comment: quit smoking 203  Substance and Sexual Activity  . Alcohol use: No  . Drug use: No  . Sexual activity: Not on file  Lifestyle  . Physical activity:    Days per week: Not on file    Minutes per session: Not on file  . Stress: Not on file  Relationships  . Social connections:    Talks on phone: Not on file    Gets together: Not on file    Attends religious service: Not on file    Active member of club or organization: Not on file    Attends meetings of clubs or organizations: Not on file    Relationship status: Not on file  . Intimate partner violence:    Fear of current or ex partner: Not on file    Emotionally abused: Not on file    Physically abused: Not on file    Forced sexual activity: Not on file  Other Topics Concern  . Not on file  Social History Narrative   Patient drinks 1/2 cup of caffeine daily.   Patient is right handed.     PHYSICAL EXAM  Vitals:   04/17/18 1522  Weight: 126 lb (57.2 kg)  Height: 5\' 4"  (1.626 m)   Body mass index is 21.63 kg/m.  Generalized: Well developed, in no acute distress  Head: normocephalic and atraumatic,. Oropharynx benign  Neck: Supple,  Musculoskeletal: No deformity   Neurological examination   Mentation: Alert oriented to time, place, history taking. Attention span and concentration appropriate. Recent and remote memory intact.  Follows all  commands speech and language fluent.   Cranial nerve II-XII: Pupils were equal round reactive to light extraocular movements were full, visual field were full on confrontational test. Facial sensation and strength were normal. hearing was intact to finger rubbing bilaterally. Uvula tongue midline. head turning and shoulder shrug were normal and symmetric.Tongue protrusion into cheek strength was normal. Motor: normal bulk and tone, full strength in the BUE, BLE, Sensory: normal and symmetric to light touch,  Coordination: finger-nose-finger, heel-to-shin bilaterally, no dysmetria Reflexes: Symmetric upper and lower plantar responses were flexor bilaterally. Gait and Station: Rising up from seated position without assistance, wide-based unsteady gait tandem gait is unsteady no assistive device  DIAGNOSTIC DATA (LABS, IMAGING, TESTING) - I reviewed patient records, labs, notes, testing and imaging myself where available.  Lab Results  Component Value Date   WBC 8.5 09/26/2017   HGB 15.1 (H) 09/26/2017   HCT 45.1 09/26/2017   MCV 95.6 09/26/2017   PLT 206 09/26/2017      Component Value Date/Time   NA 139 09/26/2017 1715   K 3.8 09/26/2017 1715   CL 101 09/26/2017 1715   CO2 27 09/26/2017 1715   GLUCOSE 101 (H) 09/26/2017 1715   BUN 13 09/26/2017 1715   CREATININE 1.17 (H) 09/26/2017 1715   CALCIUM 9.8 09/26/2017 1715   PROT 6.9 09/26/2017 1715   ALBUMIN 4.0 09/26/2017 1715   ALBUMIN 4.1 06/04/2014 1327   AST 17 09/26/2017 1715   ALT 16 09/26/2017 1715   ALKPHOS 49 09/26/2017 1715   BILITOT 0.8 09/26/2017 1715   GFRNONAA 49 (L) 09/26/2017 1715   GFRAA 57 (L) 09/26/2017 1715    Lab Results  Component Value Date   VITAMINB12 1,709 (H) 05/07/2014      ASSESSMENT AND PLAN  61 y.o. year old female here to follow-up for chronic daily headache dizziness  chronic anxiety and depression.  Patient is now seeing a psychiatrist  Continue tizanidine 4 mg 4 times a day as needed  will refill Try to avoid migraine triggers Continue follow-up with psychiatry Stay well hydrated F/u 8 months and prn Dennie Bible, [facility], [facility], APRN  [facility] [address], [place], [place] [address] [phone]

## 2018-04-17 ENCOUNTER — Other Ambulatory Visit: Payer: Self-pay | Admitting: Neurology

## 2018-04-17 ENCOUNTER — Ambulatory Visit: Payer: Medicare Other | Admitting: Nurse Practitioner

## 2018-04-17 ENCOUNTER — Encounter: Payer: Self-pay | Admitting: Nurse Practitioner

## 2018-04-17 VITALS — BP 108/69 | HR 67 | Ht 64.0 in | Wt 126.0 lb

## 2018-04-17 DIAGNOSIS — R51 Headache: Secondary | ICD-10-CM

## 2018-04-17 DIAGNOSIS — R42 Dizziness and giddiness: Secondary | ICD-10-CM | POA: Diagnosis not present

## 2018-04-17 DIAGNOSIS — R519 Headache, unspecified: Secondary | ICD-10-CM

## 2018-04-17 MED ORDER — TIZANIDINE HCL 4 MG PO TABS: ORAL_TABLET | ORAL | 2 refills | Status: DC

## 2018-04-17 NOTE — Progress Notes (Signed)
I have read the note, and I agree with the clinical assessment and plan.  Adrionna Delcid K Rohail Klees   

## 2018-04-17 NOTE — Patient Instructions (Signed)
Continue tizanidine 4 mg 4 times a day as needed will refill Try to avoid migraine triggers Stay well hydrated F/u 8 months and prn

## 2018-05-27 ENCOUNTER — Ambulatory Visit
Admission: RE | Admit: 2018-05-27 | Discharge: 2018-05-27 | Disposition: A | Discharge status: Home / Self Care | Payer: Medicare Other | Source: Ambulatory Visit | Attending: Obstetrics & Gynecology | Admitting: Obstetrics & Gynecology

## 2018-05-27 DIAGNOSIS — Z1231 Encounter for screening mammogram for malignant neoplasm of breast: Secondary | ICD-10-CM

## 2018-07-16 DIAGNOSIS — F410 Panic disorder [episodic paroxysmal anxiety] without agoraphobia: Secondary | ICD-10-CM | POA: Diagnosis not present

## 2018-12-12 ENCOUNTER — Telehealth: Payer: Self-pay

## 2018-12-12 NOTE — Telephone Encounter (Signed)
I contacted the pt in regards to her 12/16/18 appt.  Pt was offered a video visit on 12/16/18 at 4 pm and accepted.   Pt understands that although there may be some limitations with this type of visit, we will take all precautions to reduce any security or privacy concerns.  Pt understands that this will be treated like an in office visit and we will file with pt's insurance, and there may be a patient responsible charge related to this service.  Pt's e-mail address is [email or web address]. Pt's phone # is [phone]. E-mail/text has been sent to both devices.   Pt's medications, allergies and PMH have been updated.

## 2018-12-16 ENCOUNTER — Ambulatory Visit (INDEPENDENT_AMBULATORY_CARE_PROVIDER_SITE_OTHER): Payer: Medicare Other | Admitting: Neurology

## 2018-12-16 ENCOUNTER — Encounter: Payer: Self-pay | Admitting: Neurology

## 2018-12-16 ENCOUNTER — Other Ambulatory Visit: Payer: Self-pay

## 2018-12-16 DIAGNOSIS — M797 Fibromyalgia: Secondary | ICD-10-CM | POA: Diagnosis not present

## 2018-12-16 MED ORDER — TIZANIDINE HCL 4 MG PO TABS: ORAL_TABLET | ORAL | 3 refills | Status: DC

## 2018-12-16 MED ORDER — ONDANSETRON HCL 4 MG PO TABS: 4.0000 mg | ORAL_TABLET | Freq: Three times a day (TID) | ORAL | 1 refills | Status: DC | PRN

## 2018-12-16 NOTE — Progress Notes (Signed)
     Virtual Visit via Video Note  I connected with Ashlee Hooper on 12/16/18 at  4:00 PM EDT by a video enabled telemedicine application and verified that I am speaking with the correct person using two identifiers.  Location: Patient: The patient is at home. Provider: Physician in office.   I discussed the limitations of evaluation and management by telemedicine and the availability of in person appointments. The patient expressed understanding and agreed to proceed.  History of Present Illness: Ashlee Hooper is a 63 year old right-handed white female with a history of fibromyalgia type pain with burning sensation throughout her entire body.  The patient has a lot of dizziness problems and has a significant Robin with underlying anxiety and depression.  The patient is on chronic opiate therapy that she gets through her primary care physician.  She has gained benefit from the tizanidine 4 mg 4 times daily.  The patient takes Ativan 4 times a day, I believe she is on the 1 mg tablet.  She has had no real change in her underlying clinical condition since last seen.  She is operating motor vehicle, she remains active, she may walk 5 to 8 miles a day.  She could not tolerate Cymbalta or gabapentin in the past.  She returns for an evaluation.   Observations/Objective: The video evaluation reveals that the patient is alert and cooperative.  She has good symmetric face, extract movements are full.  Speech is well enunciated, not aphasic or dysarthric.  The patient is answering questions appropriately.  Assessment and Plan: 1.  Fibromyalgia  2.  Anxiety and depression  The patient will continue her current medications, a prescription for Zanaflex and Zofran were sent in.  The patient will follow-up in 6 months.   Follow Up Instructions: 27-month follow-up, may see nurse practitioner.   I discussed the assessment and treatment plan with the patient. The patient was provided an opportunity to  ask questions and all were answered. The patient agreed with the plan and demonstrated an understanding of the instructions.   The patient was advised to call back or seek an in-person evaluation if the symptoms worsen or if the condition fails to improve as anticipated.  I provided 15 minutes of non-face-to-face time during this encounter.   Kathrynn Ducking, MD

## 2019-06-19 ENCOUNTER — Ambulatory Visit: Payer: Medicare Other | Admitting: Neurology

## 2019-06-30 ENCOUNTER — Telehealth: Payer: Medicare Other | Admitting: Neurology

## 2019-07-21 ENCOUNTER — Telehealth: Payer: Self-pay | Admitting: Neurology

## 2019-07-21 NOTE — Progress Notes (Deleted)
Virtual Visit via Video Note  I connected with Ashlee Hooper on 07/21/19 at  2:45 PM EST by a video enabled telemedicine application and verified that I am speaking with the correct person using two identifiers.  Location: Patient: *** Provider: ***   I discussed the limitations of evaluation and management by telemedicine and the availability of in person appointments. The patient expressed understanding and agreed to proceed.  History of Present Illness: 12/16/2018 Dr. Jannifer Franklin: Ashlee Hooper is a 63 year old right-handed white female with a history of fibromyalgia type pain with burning sensation throughout her entire body.  The patient has a lot of dizziness problems and has a significant Robin with underlying anxiety and depression.  The patient is on chronic opiate therapy that she gets through her primary care physician.  She has gained benefit from the tizanidine 4 mg 4 times daily.  The patient takes Ativan 4 times a day, I believe she is on the 1 mg tablet.  She has had no real change in her underlying clinical condition since last seen.  She is operating motor vehicle, she remains active, she may walk 5 to 8 miles a day.  She could not tolerate Cymbalta or gabapentin in the past.  She returns for an evaluation.   Observations/Objective:   Assessment and Plan:   Follow Up Instructions:    I discussed the assessment and treatment plan with the patient. The patient was provided an opportunity to ask questions and all were answered. The patient agreed with the plan and demonstrated an understanding of the instructions.   The patient was advised to call back or seek an in-person evaluation if the symptoms worsen or if the condition fails to improve as anticipated.  I provided *** minutes of non-face-to-face time during this encounter.   Suzzanne Cloud, NP

## 2019-08-04 ENCOUNTER — Telehealth (INDEPENDENT_AMBULATORY_CARE_PROVIDER_SITE_OTHER): Payer: Medicare Other | Admitting: Neurology

## 2019-08-04 ENCOUNTER — Encounter: Payer: Self-pay | Admitting: Neurology

## 2019-08-04 DIAGNOSIS — M797 Fibromyalgia: Secondary | ICD-10-CM | POA: Diagnosis not present

## 2019-08-04 NOTE — Progress Notes (Signed)
    Virtual Visit via Video Note  I connected with Ashlee Hooper on 08/04/19 at  1:45 PM EST by a video enabled telemedicine application and verified that I am speaking with the correct person using two identifiers.  Location: Patient: At her home Provider: In the office    I discussed the limitations of evaluation and management by telemedicine and the availability of in person appointments. The patient expressed understanding and agreed to proceed.  History of Present Illness: 08/05/2019 SS: Ashlee Hooper is a 63 year old female with history of fibromyalgia.  She also complains of dizziness, has significant anxiety and depression.  She has been unable to tolerate Cymbalta or gabapentin.  She is receiving opiate medication from her primary doctor. She remains under the care of a psychiatrist.  She reports that her stomach continues to burn all the time, but they were unable to determine etiology.  She has been told it is related to stress.  She has been busy for the last several months, as she has a 54 month old puppy.  She reports she remains active, walks daily.  She receives tizanidine and Zofran from this office.  She reports history of gait instability, but usually does not fall.  She did have a fall today, she got tripped up in her puppy while doing laundry.  She presents today for follow-up via virtual visit  12/16/2018 Dr. Jannifer Franklin: Ashlee Hooper is a 63 year old right-handed white female with a history of fibromyalgia type pain with burning sensation throughout her entire body.  The patient has a lot of dizziness problems and has a significant Robin with underlying anxiety and depression.  The patient is on chronic opiate therapy that she gets through her primary care physician.  She has gained benefit from the tizanidine 4 mg 4 times daily.  The patient takes Ativan 4 times a day, I believe she is on the 1 mg tablet.  She has had no real change in her underlying clinical condition since last  seen.  She is operating motor vehicle, she remains active, she may walk 5 to 8 miles a day.  She could not tolerate Cymbalta or gabapentin in the past.  She returns for an evaluation.   Observations/Objective: Via virtual visit, is alert and oriented, answers questions appropriately, speech is clear and concise, facial symmetry noted, follows commands, gait appears intact, no arm drift  Assessment and Plan: 1.  Fibromyalgia 2.  Anxiety and depression  Overall, she seems to have remained stable.  She continues to deal with fibromyalgia, the report of burning pain all over, along with anxiety.  She remains on opiate therapy from her primary doctor.  She is receiving tizanidine and Zofran from this office.  She will remain on these medications, but does not need a refill at this time.  She will follow-up in 8 months or sooner if needed.  Follow Up Instructions: 8 months 04/07/2020 3:45   I discussed the assessment and treatment plan with the patient. The patient was provided an opportunity to ask questions and all were answered. The patient agreed with the plan and demonstrated an understanding of the instructions.   The patient was advised to call back or seek an in-person evaluation if the symptoms worsen or if the condition fails to improve as anticipated.  I provided 15 minutes of non-face-to-face time during this encounter.  Evangeline Dakin, DNP  [facility] [address] [place], [place] [address] [phone]

## 2019-08-04 NOTE — Progress Notes (Signed)
I have read the note, and I agree with the clinical assessment and plan.  Ashlee Hooper K Hooper Ashlee   

## 2019-10-23 ENCOUNTER — Other Ambulatory Visit: Payer: Self-pay | Admitting: Neurology

## 2019-12-01 ENCOUNTER — Other Ambulatory Visit: Payer: Self-pay | Admitting: Neurology

## 2020-04-07 ENCOUNTER — Ambulatory Visit: Payer: Medicare PPO | Admitting: Neurology

## 2020-04-07 ENCOUNTER — Encounter: Payer: Self-pay | Admitting: Neurology

## 2020-04-07 VITALS — BP 115/77 | HR 80 | Ht 63.0 in | Wt 123.0 lb

## 2020-04-07 DIAGNOSIS — M797 Fibromyalgia: Secondary | ICD-10-CM | POA: Diagnosis not present

## 2020-04-07 MED ORDER — ONDANSETRON HCL 4 MG PO TABS: 4.0000 mg | ORAL_TABLET | Freq: Three times a day (TID) | ORAL | 1 refills | Status: DC | PRN

## 2020-04-07 NOTE — Progress Notes (Signed)
PATIENT: Ashlee Hooper DOB: Feb 07, 1956  REASON FOR VISIT: follow up HISTORY FROM: patient  HISTORY OF PRESENT ILLNESS: Today 04/07/20  Ashlee Hooper is a 64 year old female with history of fibromyalgia, complains of burning sensation throughout her body.  She also has dizziness, significant anxiety and depression.  Could not tolerate Cymbalta or gabapentin.  She receives opiate medication from her primary doctor.  She sees a Teacher, music.  She receives tizanidine and Zofran from this office. Overall health is good.  She lives with her husband and dog.  Presents today for follow-up unaccompanied.  HISTORY 08/05/2019 SS: Ashlee Hooper is a 64 year old female with history of fibromyalgia.  She also complains of dizziness, has significant anxiety and depression.  She has been unable to tolerate Cymbalta or gabapentin.  She is receiving opiate medication from her primary doctor. She remains under the care of a psychiatrist.  She reports that her stomach continues to burn all the time, but they were unable to determine etiology.  She has been told it is related to stress.  She has been busy for the last several months, as she has a 23 month old puppy.  She reports she remains active, walks daily.  She receives tizanidine and Zofran from this office.  She reports history of gait instability, but usually does not fall.  She did have a fall today, she got tripped up in her puppy while doing laundry.  She presents today for follow-up via virtual visit   REVIEW OF SYSTEMS: Out of a complete 14 system review of symptoms, the patient complains only of the following symptoms, and all other reviewed systems are negative.  See HPI  ALLERGIES: Allergies  Allergen Reactions   Sulfa Antibiotics Nausea And Vomiting   Bactrim [Sulfamethoxazole-Trimethoprim]    Codeine Sulfate    Penicillins    Wellbutrin [Bupropion] Other (See Comments)   Zoloft [Sertraline Hcl] Other (See Comments)    HOME  MEDICATIONS: Outpatient Medications Prior to Visit  Medication Sig Dispense Refill   Cholecalciferol (VITAMIN D3) 2000 UNITS TABS Take 1 tablet by mouth daily.     Coenzyme Q10 (COQ10) 30 MG CAPS Take 60 mg by mouth daily.     estradiol (VIVELLE-DOT) 0.1 MG/24HR patch Place 1 patch onto the skin 2 (two) times a week.     L-Lysine 500 MG CAPS Take 1 capsule by mouth daily.     LORazepam (ATIVAN) 1 MG tablet Take 1 mg by mouth every 8 (eight) hours. 1 in the am 3 before bedtime     MAGNESIUM PO Take 2 tablets by mouth daily.     Omega-3 Fatty Acids (FISH OIL) 1000 MG CAPS Take 2 capsules by mouth daily.     oxycodone (ROXICODONE) 30 MG immediate release tablet Take 30 mg by mouth every 8 (eight) hours as needed for pain.     OXYCONTIN 40 MG T12A 12 hr tablet Take 40 mg by mouth daily.     polyethylene glycol (MIRALAX / GLYCOLAX) packet Take 527 g by mouth daily.     pravastatin (PRAVACHOL) 40 MG tablet Take 40 mg by mouth daily.     SUMAtriptan (IMITREX) 20 MG/ACT nasal spray Place 20 mg into the nose every 2 (two) hours as needed for migraine or headache. May repeat in 2 hours if headache persists or recurs.     tiZANidine (ZANAFLEX) 4 MG tablet TAKE 1 TABLET BY MOUTH FOUR TIMES A DAY AS NEEDED 360 tablet 3   triamterene-hydrochlorothiazide (MAXZIDE-25) 37.5-25 MG  per tablet Take 1 tablet by mouth daily.     valACYclovir (VALTREX) 1000 MG tablet Take 1,000 mg by mouth as needed.      vitamin B-12 (CYANOCOBALAMIN) 1000 MCG tablet Take 1,000 mcg by mouth daily.     vitamin C (ASCORBIC ACID) 500 MG tablet Take 500 mg by mouth daily.     vitamin E 400 UNIT capsule Take 400 Units by mouth daily.     ondansetron (ZOFRAN) 4 MG tablet Take 1 tablet (4 mg total) by mouth every 8 (eight) hours as needed for nausea or vomiting. 20 tablet 1   butalbital-acetaminophen-caffeine (FIORICET, ESGIC) 50-325-40 MG tablet TABLET BY MOUTH EVERY 6 HOURS AS NEEDED FOR HEADACHE. MUST LAST 20 DAYS 30  tablet 1   pantoprazole (PROTONIX) 40 MG tablet Take 40 mg by mouth daily.  0   No facility-administered medications prior to visit.    PAST MEDICAL HISTORY: Past Medical History:  Diagnosis Date   Backache, unspecified    Chronic fatigue    Dizziness and giddiness 04/28/2014   Fibromyalgia    Headache(784.0) 04/28/2014   Insomnia    Menopause    Migraine    Mixed hyperlipidemia    Peripheral neuropathy    Venous insufficiency    Vertigo     PAST SURGICAL HISTORY: Past Surgical History:  Procedure Laterality Date   CATARACT EXTRACTION  2017   COLONOSCOPY     HEMORRHOID SURGERY     LAPAROSCOPY     TONSILLECTOMY     TOTAL ABDOMINAL HYSTERECTOMY W/ BILATERAL SALPINGOOPHORECTOMY      FAMILY HISTORY: Family History  Problem Relation Age of Onset   Cirrhosis Father    Alcohol abuse Father    Fibromyalgia Sister     SOCIAL HISTORY: Social History   Socioeconomic History   Marital status: Married    Spouse name: Not on file   Number of children: 1   Years of education: BA   Highest education level: Not on file  Occupational History   Occupation: disabled   Occupation: Pharmacist, hospital  Tobacco Use   Smoking status: Former Smoker    Packs/day: 1.00    Years: 30.00    Pack years: 30.00    Types: Cigarettes   Smokeless tobacco: Never Used   Tobacco comment: quit smoking 203  Vaping Use   Vaping Use: Never used  Substance and Sexual Activity   Alcohol use: No   Drug use: No   Sexual activity: Not on file  Other Topics Concern   Not on file  Social History Narrative   Patient drinks 1/2 cup of caffeine daily.   Patient is right handed.   Social Determinants of Health   Financial Resource Strain:    Difficulty of Paying Living Expenses: Not on file  Food Insecurity:    Worried About Charity fundraiser in the Last Year: Not on file   [employer] of Food in the Last Year: Not on file  Transportation Needs:    Lack of  Transportation (Medical): Not on file   Lack of Transportation (Non-Medical): Not on file  Physical Activity:    Days of Exercise per Week: Not on file   Minutes of Exercise per Session: Not on file  Stress:    Feeling of Stress : Not on file  Social Connections:    Frequency of Communication with Friends and Family: Not on file   Frequency of Social Gatherings with Friends and Family: Not on file   Attends  Religious Services: Not on file   Active Member of Clubs or Organizations: Not on file   Attends Club or Organization Meetings: Not on file   Marital Status: Not on file  Intimate Partner Violence:    Fear of Current or Ex-Partner: Not on file   Emotionally Abused: Not on file   Physically Abused: Not on file   Sexually Abused: Not on file   PHYSICAL EXAM  Vitals:   04/07/20 1534  BP: 115/77  Pulse: 80  Weight: 123 lb (55.8 kg)  Height: 5\' 3"  (1.6 m)   Body mass index is 21.79 kg/m.  Generalized: Well developed, in no acute distress   Neurological examination  Mentation: Alert oriented to time, place, history taking. Follows all commands speech and language fluent Cranial nerve II-XII: Pupils were equal round reactive to light. Extraocular movements were full, visual field were full on confrontational test. Facial sensation and strength were normal.  Head turning and shoulder shrug  were normal and symmetric. Motor: The motor testing reveals 5 over 5 strength of all 4 extremities. Good symmetric motor tone is noted throughout.  Sensory: Sensory testing is intact to soft touch on all 4 extremities. No evidence of extinction is noted.  Coordination: Cerebellar testing reveals good finger-nose-finger and heel-to-shin bilaterally.  Gait and station: Gait is normal. Tandem gait is unsteady. Romberg is negative. No drift is seen.  Reflexes: Deep tendon reflexes are symmetric and normal bilaterally.   DIAGNOSTIC DATA (LABS, IMAGING, TESTING) - I reviewed patient  records, labs, notes, testing and imaging myself where available.  Lab Results  Component Value Date   WBC 8.5 09/26/2017   HGB 15.1 (H) 09/26/2017   HCT 45.1 09/26/2017   MCV 95.6 09/26/2017   PLT 206 09/26/2017      Component Value Date/Time   NA 139 09/26/2017 1715   K 3.8 09/26/2017 1715   CL 101 09/26/2017 1715   CO2 27 09/26/2017 1715   GLUCOSE 101 (H) 09/26/2017 1715   BUN 13 09/26/2017 1715   CREATININE 1.17 (H) 09/26/2017 1715   CALCIUM 9.8 09/26/2017 1715   PROT 6.9 09/26/2017 1715   ALBUMIN 4.0 09/26/2017 1715   ALBUMIN 4.1 06/04/2014 1327   AST 17 09/26/2017 1715   ALT 16 09/26/2017 1715   ALKPHOS 49 09/26/2017 1715   BILITOT 0.8 09/26/2017 1715   GFRNONAA 49 (L) 09/26/2017 1715   GFRAA 57 (L) 09/26/2017 1715   No results found for: CHOL, HDL, LDLCALC, LDLDIRECT, TRIG, CHOLHDL No results found for: HGBA1C Lab Results  Component Value Date   VITAMINB12 1,709 (H) 05/07/2014   No results found for: TSH    ASSESSMENT AND PLAN 64 y.o. year old female  has a past medical history of Backache, unspecified, Chronic fatigue, Dizziness and giddiness (04/28/2014), Fibromyalgia, Headache(784.0) (04/28/2014), Insomnia, Menopause, Migraine, Mixed hyperlipidemia, Peripheral neuropathy, Venous insufficiency, and Vertigo. here with:  1.  Fibromyalgia 2.  Anxiety and depression  -Condition remains overall stable -Will send refill of Zofran as needed for nausea -Continue tizanidine 4 mg 4 times daily PRN -Follow-up at this office on an as-needed basis, PCP can assume her future refills, PCP is also prescribing OxyContin and oxycodone  I spent 20 minutes of face-to-face and non-face-to-face time with patient.  This included previsit chart review, lab review, study review, order entry, electronic health record documentation, patient education.  Butler Denmark, AGNP-C, DNP 04/07/2020, 4:13 PM [facility] [address] [place], [place] [address] [phone]

## 2020-04-07 NOTE — Patient Instructions (Signed)
I will refill Zofran  Continue tizanidine at current dosing Continue seeing your primary doctor  Follow-up here as needed

## 2020-04-08 NOTE — Progress Notes (Signed)
I have read the note, and I agree with the clinical assessment and plan.  Randen Kauth K Ridgely Anastacio   

## 2020-10-08 DIAGNOSIS — E782 Mixed hyperlipidemia: Secondary | ICD-10-CM | POA: Diagnosis not present

## 2020-10-08 DIAGNOSIS — I10 Essential (primary) hypertension: Secondary | ICD-10-CM | POA: Diagnosis not present

## 2020-10-08 DIAGNOSIS — Z5181 Encounter for therapeutic drug level monitoring: Secondary | ICD-10-CM | POA: Diagnosis not present

## 2020-10-08 DIAGNOSIS — Z0000 Encounter for general adult medical examination without abnormal findings: Secondary | ICD-10-CM | POA: Diagnosis not present

## 2020-10-14 DIAGNOSIS — N1831 Chronic kidney disease, stage 3a: Secondary | ICD-10-CM | POA: Diagnosis not present

## 2020-10-14 DIAGNOSIS — M797 Fibromyalgia: Secondary | ICD-10-CM | POA: Diagnosis not present

## 2020-10-14 DIAGNOSIS — Z0000 Encounter for general adult medical examination without abnormal findings: Secondary | ICD-10-CM | POA: Diagnosis not present

## 2020-10-14 DIAGNOSIS — M159 Polyosteoarthritis, unspecified: Secondary | ICD-10-CM | POA: Diagnosis not present

## 2020-10-14 DIAGNOSIS — I700 Atherosclerosis of aorta: Secondary | ICD-10-CM | POA: Diagnosis not present

## 2020-10-14 DIAGNOSIS — E782 Mixed hyperlipidemia: Secondary | ICD-10-CM | POA: Diagnosis not present

## 2020-10-14 DIAGNOSIS — G603 Idiopathic progressive neuropathy: Secondary | ICD-10-CM | POA: Diagnosis not present

## 2020-10-14 DIAGNOSIS — I872 Venous insufficiency (chronic) (peripheral): Secondary | ICD-10-CM | POA: Diagnosis not present

## 2020-10-14 DIAGNOSIS — I10 Essential (primary) hypertension: Secondary | ICD-10-CM | POA: Diagnosis not present

## 2020-11-17 ENCOUNTER — Other Ambulatory Visit: Payer: Self-pay | Admitting: Neurology

## 2021-02-16 ENCOUNTER — Other Ambulatory Visit: Payer: Self-pay | Admitting: Neurology

## 2021-02-24 DIAGNOSIS — M797 Fibromyalgia: Secondary | ICD-10-CM | POA: Diagnosis not present

## 2021-02-24 DIAGNOSIS — I10 Essential (primary) hypertension: Secondary | ICD-10-CM | POA: Diagnosis not present

## 2021-02-24 DIAGNOSIS — I700 Atherosclerosis of aorta: Secondary | ICD-10-CM | POA: Diagnosis not present

## 2021-02-24 DIAGNOSIS — G603 Idiopathic progressive neuropathy: Secondary | ICD-10-CM | POA: Diagnosis not present

## 2021-02-24 DIAGNOSIS — E782 Mixed hyperlipidemia: Secondary | ICD-10-CM | POA: Diagnosis not present

## 2021-02-24 DIAGNOSIS — M159 Polyosteoarthritis, unspecified: Secondary | ICD-10-CM | POA: Diagnosis not present

## 2021-02-24 DIAGNOSIS — F1190 Opioid use, unspecified, uncomplicated: Secondary | ICD-10-CM | POA: Diagnosis not present

## 2021-03-31 DIAGNOSIS — F410 Panic disorder [episodic paroxysmal anxiety] without agoraphobia: Secondary | ICD-10-CM | POA: Diagnosis not present

## 2021-04-27 ENCOUNTER — Other Ambulatory Visit: Payer: Self-pay | Admitting: Internal Medicine

## 2021-04-27 DIAGNOSIS — Z1231 Encounter for screening mammogram for malignant neoplasm of breast: Secondary | ICD-10-CM

## 2021-06-04 ENCOUNTER — Other Ambulatory Visit: Payer: Self-pay | Admitting: Neurology

## 2021-06-06 NOTE — Telephone Encounter (Signed)
Please request future refills from PCP.

## 2021-06-23 DIAGNOSIS — E782 Mixed hyperlipidemia: Secondary | ICD-10-CM | POA: Diagnosis not present

## 2021-06-23 DIAGNOSIS — G603 Idiopathic progressive neuropathy: Secondary | ICD-10-CM | POA: Diagnosis not present

## 2021-06-23 DIAGNOSIS — M797 Fibromyalgia: Secondary | ICD-10-CM | POA: Diagnosis not present

## 2021-06-23 DIAGNOSIS — F1190 Opioid use, unspecified, uncomplicated: Secondary | ICD-10-CM | POA: Diagnosis not present

## 2021-06-23 DIAGNOSIS — I10 Essential (primary) hypertension: Secondary | ICD-10-CM | POA: Diagnosis not present

## 2021-06-23 DIAGNOSIS — I700 Atherosclerosis of aorta: Secondary | ICD-10-CM | POA: Diagnosis not present

## 2021-06-23 DIAGNOSIS — Z5181 Encounter for therapeutic drug level monitoring: Secondary | ICD-10-CM | POA: Diagnosis not present

## 2021-06-23 DIAGNOSIS — M159 Polyosteoarthritis, unspecified: Secondary | ICD-10-CM | POA: Diagnosis not present

## 2021-07-11 DIAGNOSIS — Z124 Encounter for screening for malignant neoplasm of cervix: Secondary | ICD-10-CM | POA: Diagnosis not present

## 2021-07-11 DIAGNOSIS — Z6824 Body mass index (BMI) 24.0-24.9, adult: Secondary | ICD-10-CM | POA: Diagnosis not present

## 2021-07-19 ENCOUNTER — Ambulatory Visit
Admission: RE | Admit: 2021-07-19 | Discharge: 2021-07-19 | Disposition: A | Discharge status: Home / Self Care | Payer: Medicare PPO | Source: Ambulatory Visit | Attending: Internal Medicine | Admitting: Internal Medicine

## 2021-07-19 ENCOUNTER — Other Ambulatory Visit: Payer: Self-pay

## 2021-07-19 DIAGNOSIS — Z1231 Encounter for screening mammogram for malignant neoplasm of breast: Secondary | ICD-10-CM

## 2021-07-21 ENCOUNTER — Other Ambulatory Visit: Payer: Self-pay | Admitting: Internal Medicine

## 2021-07-21 DIAGNOSIS — R928 Other abnormal and inconclusive findings on diagnostic imaging of breast: Secondary | ICD-10-CM

## 2021-07-29 ENCOUNTER — Ambulatory Visit
Admission: RE | Admit: 2021-07-29 | Discharge: 2021-07-29 | Disposition: A | Discharge status: Home / Self Care | Payer: Medicare PPO | Source: Ambulatory Visit | Attending: Internal Medicine | Admitting: Internal Medicine

## 2021-07-29 ENCOUNTER — Ambulatory Visit: Payer: Medicare PPO

## 2021-07-29 DIAGNOSIS — R922 Inconclusive mammogram: Secondary | ICD-10-CM | POA: Diagnosis not present

## 2021-07-29 DIAGNOSIS — R928 Other abnormal and inconclusive findings on diagnostic imaging of breast: Secondary | ICD-10-CM

## 2021-09-01 ENCOUNTER — Other Ambulatory Visit: Payer: Medicare PPO

## 2021-10-20 DIAGNOSIS — F1190 Opioid use, unspecified, uncomplicated: Secondary | ICD-10-CM | POA: Diagnosis not present

## 2021-10-20 DIAGNOSIS — I10 Essential (primary) hypertension: Secondary | ICD-10-CM | POA: Diagnosis not present

## 2021-10-20 DIAGNOSIS — E782 Mixed hyperlipidemia: Secondary | ICD-10-CM | POA: Diagnosis not present

## 2021-10-20 DIAGNOSIS — Z5181 Encounter for therapeutic drug level monitoring: Secondary | ICD-10-CM | POA: Diagnosis not present

## 2021-10-20 DIAGNOSIS — R5383 Other fatigue: Secondary | ICD-10-CM | POA: Diagnosis not present

## 2021-10-20 DIAGNOSIS — Z0000 Encounter for general adult medical examination without abnormal findings: Secondary | ICD-10-CM | POA: Diagnosis not present

## 2021-10-27 DIAGNOSIS — N1831 Chronic kidney disease, stage 3a: Secondary | ICD-10-CM | POA: Diagnosis not present

## 2021-10-27 DIAGNOSIS — M797 Fibromyalgia: Secondary | ICD-10-CM | POA: Diagnosis not present

## 2021-10-27 DIAGNOSIS — I10 Essential (primary) hypertension: Secondary | ICD-10-CM | POA: Diagnosis not present

## 2021-10-27 DIAGNOSIS — I872 Venous insufficiency (chronic) (peripheral): Secondary | ICD-10-CM | POA: Diagnosis not present

## 2021-10-27 DIAGNOSIS — E782 Mixed hyperlipidemia: Secondary | ICD-10-CM | POA: Diagnosis not present

## 2021-10-27 DIAGNOSIS — I700 Atherosclerosis of aorta: Secondary | ICD-10-CM | POA: Diagnosis not present

## 2021-10-27 DIAGNOSIS — G603 Idiopathic progressive neuropathy: Secondary | ICD-10-CM | POA: Diagnosis not present

## 2021-10-27 DIAGNOSIS — F1190 Opioid use, unspecified, uncomplicated: Secondary | ICD-10-CM | POA: Diagnosis not present

## 2021-10-27 DIAGNOSIS — Z0000 Encounter for general adult medical examination without abnormal findings: Secondary | ICD-10-CM | POA: Diagnosis not present

## 2022-03-02 DIAGNOSIS — I10 Essential (primary) hypertension: Secondary | ICD-10-CM | POA: Diagnosis not present

## 2022-03-02 DIAGNOSIS — F1190 Opioid use, unspecified, uncomplicated: Secondary | ICD-10-CM | POA: Diagnosis not present

## 2022-03-02 DIAGNOSIS — I872 Venous insufficiency (chronic) (peripheral): Secondary | ICD-10-CM | POA: Diagnosis not present

## 2022-03-02 DIAGNOSIS — G603 Idiopathic progressive neuropathy: Secondary | ICD-10-CM | POA: Diagnosis not present

## 2022-03-02 DIAGNOSIS — R42 Dizziness and giddiness: Secondary | ICD-10-CM | POA: Diagnosis not present

## 2022-03-02 DIAGNOSIS — I700 Atherosclerosis of aorta: Secondary | ICD-10-CM | POA: Diagnosis not present

## 2022-03-02 DIAGNOSIS — N1831 Chronic kidney disease, stage 3a: Secondary | ICD-10-CM | POA: Diagnosis not present

## 2022-03-02 DIAGNOSIS — M797 Fibromyalgia: Secondary | ICD-10-CM | POA: Diagnosis not present

## 2022-03-02 DIAGNOSIS — E782 Mixed hyperlipidemia: Secondary | ICD-10-CM | POA: Diagnosis not present

## 2022-04-03 DIAGNOSIS — L259 Unspecified contact dermatitis, unspecified cause: Secondary | ICD-10-CM | POA: Diagnosis not present

## 2022-04-03 DIAGNOSIS — D225 Melanocytic nevi of trunk: Secondary | ICD-10-CM | POA: Diagnosis not present

## 2022-05-03 DIAGNOSIS — F332 Major depressive disorder, recurrent severe without psychotic features: Secondary | ICD-10-CM | POA: Diagnosis not present

## 2022-05-03 DIAGNOSIS — F410 Panic disorder [episodic paroxysmal anxiety] without agoraphobia: Secondary | ICD-10-CM | POA: Diagnosis not present

## 2022-07-03 DIAGNOSIS — F1190 Opioid use, unspecified, uncomplicated: Secondary | ICD-10-CM | POA: Diagnosis not present

## 2022-07-03 DIAGNOSIS — I10 Essential (primary) hypertension: Secondary | ICD-10-CM | POA: Diagnosis not present

## 2022-07-03 DIAGNOSIS — M797 Fibromyalgia: Secondary | ICD-10-CM | POA: Diagnosis not present

## 2022-07-03 DIAGNOSIS — E782 Mixed hyperlipidemia: Secondary | ICD-10-CM | POA: Diagnosis not present

## 2022-07-03 DIAGNOSIS — G603 Idiopathic progressive neuropathy: Secondary | ICD-10-CM | POA: Diagnosis not present

## 2022-07-03 DIAGNOSIS — R42 Dizziness and giddiness: Secondary | ICD-10-CM | POA: Diagnosis not present

## 2022-07-03 DIAGNOSIS — I872 Venous insufficiency (chronic) (peripheral): Secondary | ICD-10-CM | POA: Diagnosis not present

## 2022-07-03 DIAGNOSIS — N1831 Chronic kidney disease, stage 3a: Secondary | ICD-10-CM | POA: Diagnosis not present

## 2022-07-03 DIAGNOSIS — I700 Atherosclerosis of aorta: Secondary | ICD-10-CM | POA: Diagnosis not present

## 2022-07-24 ENCOUNTER — Other Ambulatory Visit: Payer: Self-pay | Admitting: Neurology

## 2022-10-01 IMAGING — MG MM DIGITAL DIAGNOSTIC UNILAT*R* W/ TOMO W/ CAD
4 series · 4 of 12 positions shown · non-contrast
Comparison: Previous exam(s).

CLINICAL DATA: Recall from screening mammography, possible focal
asymmetry involving the outer retroareolar RIGHT breast at anterior
depth.

EXAM:
DIGITAL DIAGNOSTIC UNILATERAL RIGHT MAMMOGRAM WITH TOMOSYNTHESIS AND
CAD
TECHNIQUE: Right digital diagnostic mammography and breast tomosynthesis was
performed. The images were evaluated with computer-aided detection.

[R CC synth-2D]
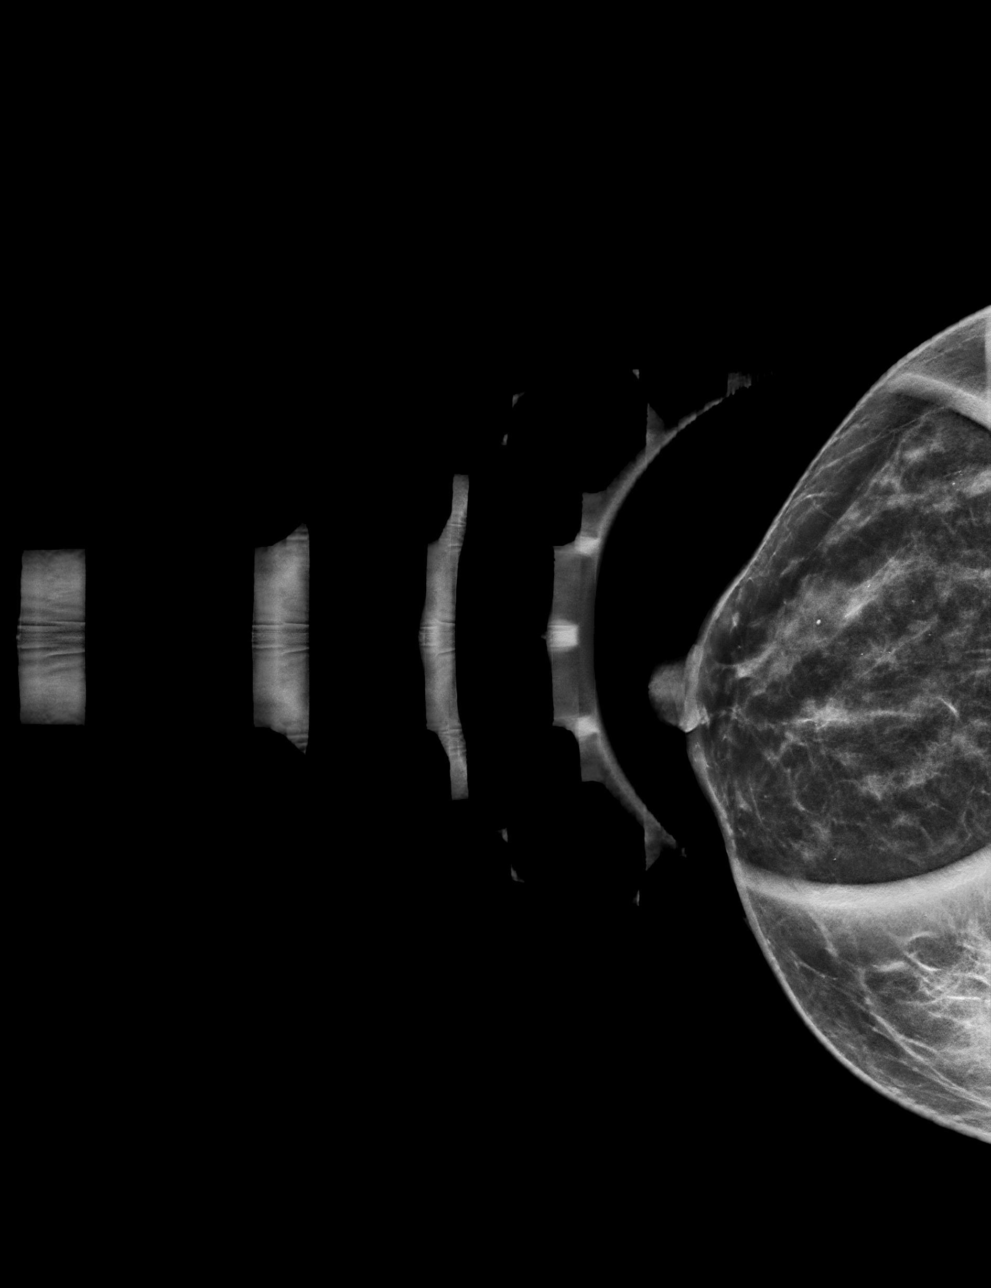

[R MLO synth-2D]
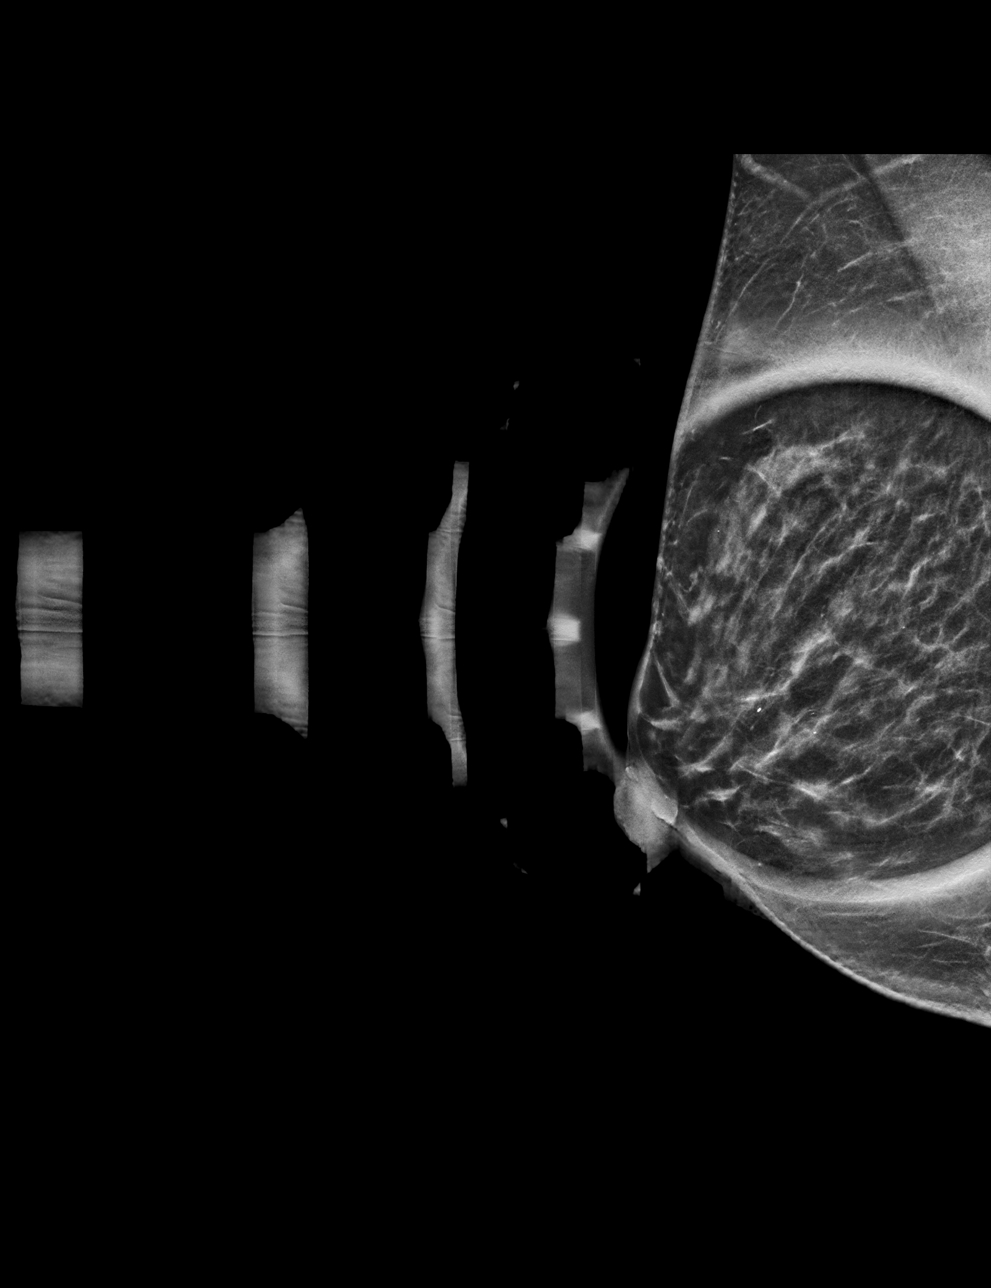

[R CC tomo · tomo slice 25/48.0]
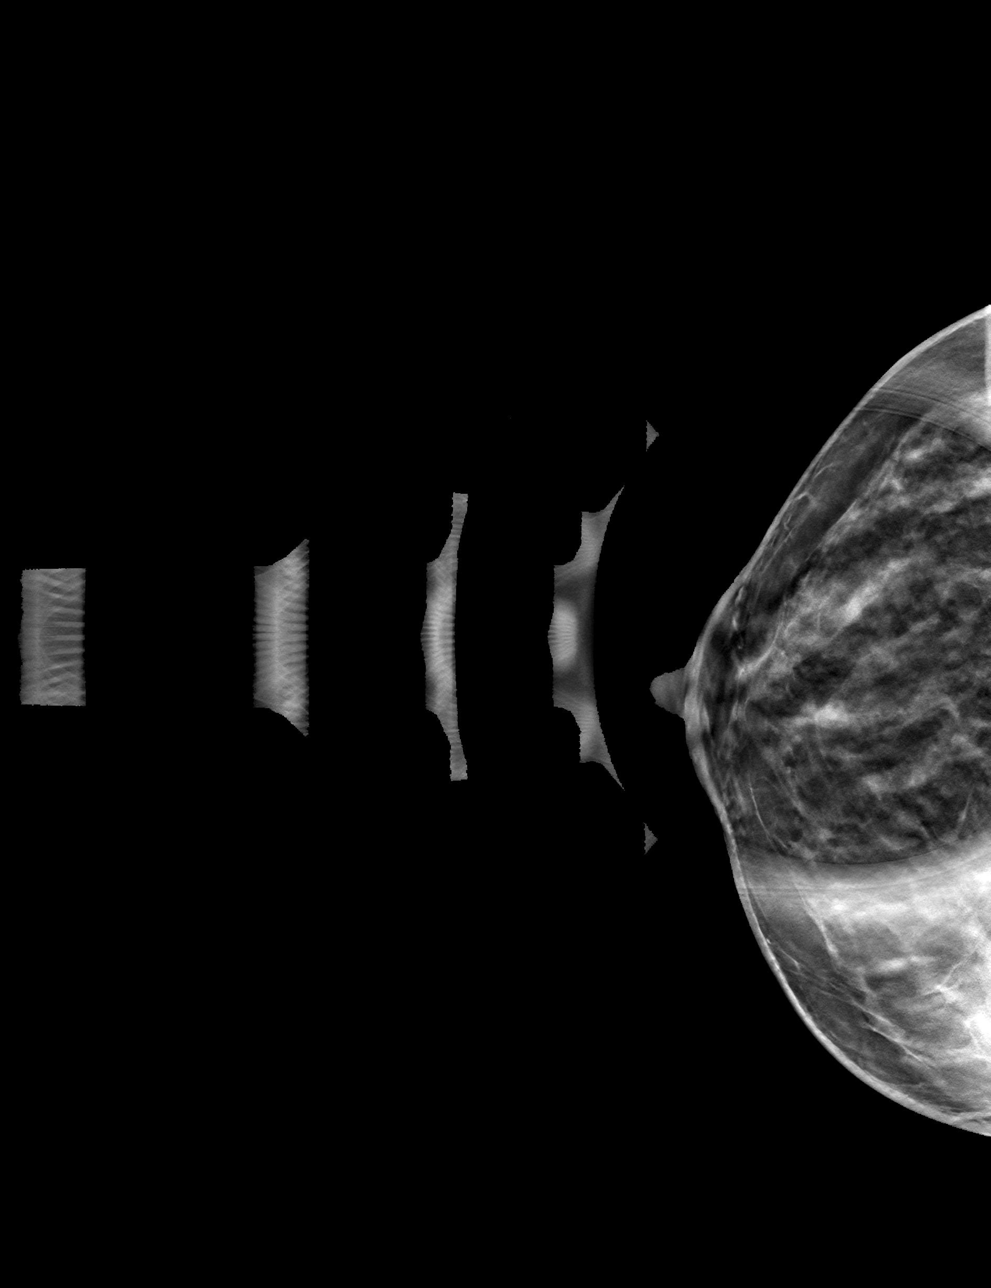

[R MLO tomo · tomo slice 26/51.0]
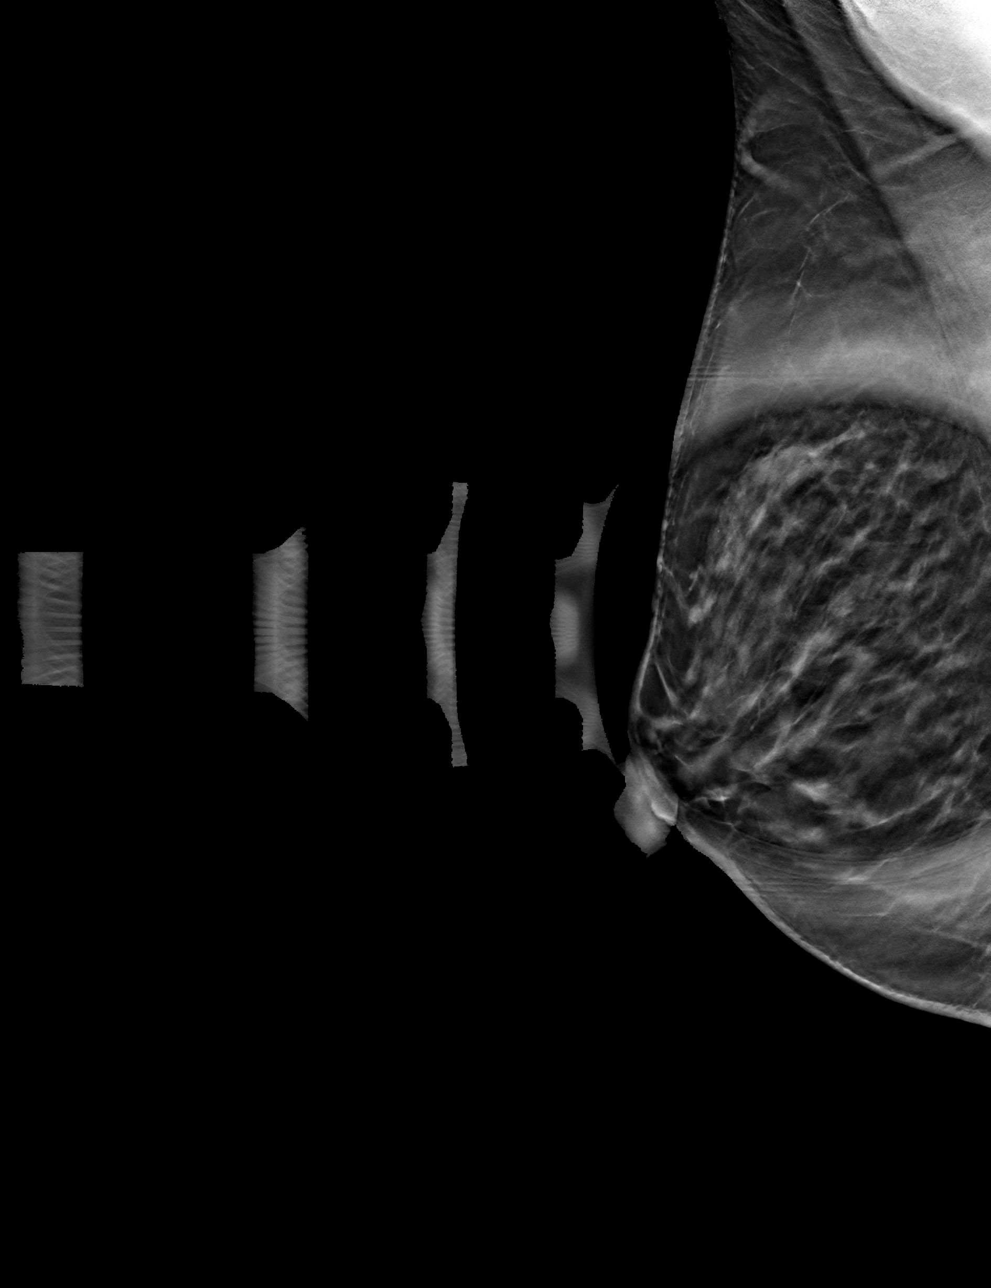

[4 of 12 positions shown; findings below may reference images not displayed]

ACR Breast Density Category c: The breast tissue is heterogeneously
dense, which may obscure small masses.
FINDINGS: Spot-compression CC and MLO views of the area of concern were
obtained.

The focal asymmetry questioned on screening mammography in the outer
retroareolar location disperses with compression, indicating
overlapping fibroglandular tissue. There is no underlying mass or
architectural distortion.

No findings suspicious for malignancy.
IMPRESSION: No mammographic evidence of malignancy involving the RIGHT breast.

RECOMMENDATION:
Screening mammogram in one year.(Code:[id number])

I have discussed the findings and recommendations with the patient.
If applicable, a reminder letter will be sent to the patient
regarding the next appointment.

BI-RADS CATEGORY  1: Negative.

## 2022-11-01 DIAGNOSIS — F332 Major depressive disorder, recurrent severe without psychotic features: Secondary | ICD-10-CM | POA: Diagnosis not present

## 2022-11-01 DIAGNOSIS — F410 Panic disorder [episodic paroxysmal anxiety] without agoraphobia: Secondary | ICD-10-CM | POA: Diagnosis not present

## 2022-11-23 DIAGNOSIS — I10 Essential (primary) hypertension: Secondary | ICD-10-CM | POA: Diagnosis not present

## 2022-11-23 DIAGNOSIS — I700 Atherosclerosis of aorta: Secondary | ICD-10-CM | POA: Diagnosis not present

## 2022-11-23 DIAGNOSIS — I872 Venous insufficiency (chronic) (peripheral): Secondary | ICD-10-CM | POA: Diagnosis not present

## 2022-11-23 DIAGNOSIS — R42 Dizziness and giddiness: Secondary | ICD-10-CM | POA: Diagnosis not present

## 2022-11-23 DIAGNOSIS — G603 Idiopathic progressive neuropathy: Secondary | ICD-10-CM | POA: Diagnosis not present

## 2022-11-23 DIAGNOSIS — F1190 Opioid use, unspecified, uncomplicated: Secondary | ICD-10-CM | POA: Diagnosis not present

## 2022-11-23 DIAGNOSIS — N1831 Chronic kidney disease, stage 3a: Secondary | ICD-10-CM | POA: Diagnosis not present

## 2022-11-23 DIAGNOSIS — M797 Fibromyalgia: Secondary | ICD-10-CM | POA: Diagnosis not present

## 2022-11-23 DIAGNOSIS — E782 Mixed hyperlipidemia: Secondary | ICD-10-CM | POA: Diagnosis not present

## 2022-11-30 DIAGNOSIS — Z0000 Encounter for general adult medical examination without abnormal findings: Secondary | ICD-10-CM | POA: Diagnosis not present

## 2022-11-30 DIAGNOSIS — I872 Venous insufficiency (chronic) (peripheral): Secondary | ICD-10-CM | POA: Diagnosis not present

## 2022-11-30 DIAGNOSIS — G603 Idiopathic progressive neuropathy: Secondary | ICD-10-CM | POA: Diagnosis not present

## 2022-11-30 DIAGNOSIS — I700 Atherosclerosis of aorta: Secondary | ICD-10-CM | POA: Diagnosis not present

## 2022-11-30 DIAGNOSIS — M159 Polyosteoarthritis, unspecified: Secondary | ICD-10-CM | POA: Diagnosis not present

## 2022-11-30 DIAGNOSIS — E782 Mixed hyperlipidemia: Secondary | ICD-10-CM | POA: Diagnosis not present

## 2022-11-30 DIAGNOSIS — M797 Fibromyalgia: Secondary | ICD-10-CM | POA: Diagnosis not present

## 2022-11-30 DIAGNOSIS — N1831 Chronic kidney disease, stage 3a: Secondary | ICD-10-CM | POA: Diagnosis not present

## 2022-11-30 DIAGNOSIS — I10 Essential (primary) hypertension: Secondary | ICD-10-CM | POA: Diagnosis not present

## 2022-12-26 DIAGNOSIS — R42 Dizziness and giddiness: Secondary | ICD-10-CM | POA: Diagnosis not present

## 2023-02-08 ENCOUNTER — Encounter: Payer: Self-pay | Admitting: Gastroenterology

## 2023-03-06 DIAGNOSIS — Z01419 Encounter for gynecological examination (general) (routine) without abnormal findings: Secondary | ICD-10-CM | POA: Diagnosis not present

## 2023-03-06 DIAGNOSIS — Z6822 Body mass index (BMI) 22.0-22.9, adult: Secondary | ICD-10-CM | POA: Diagnosis not present

## 2023-04-03 DIAGNOSIS — D1801 Hemangioma of skin and subcutaneous tissue: Secondary | ICD-10-CM | POA: Diagnosis not present

## 2023-04-03 DIAGNOSIS — L821 Other seborrheic keratosis: Secondary | ICD-10-CM | POA: Diagnosis not present

## 2023-04-05 DIAGNOSIS — I700 Atherosclerosis of aorta: Secondary | ICD-10-CM | POA: Diagnosis not present

## 2023-04-05 DIAGNOSIS — E782 Mixed hyperlipidemia: Secondary | ICD-10-CM | POA: Diagnosis not present

## 2023-04-05 DIAGNOSIS — I10 Essential (primary) hypertension: Secondary | ICD-10-CM | POA: Diagnosis not present

## 2023-04-05 DIAGNOSIS — M159 Polyosteoarthritis, unspecified: Secondary | ICD-10-CM | POA: Diagnosis not present

## 2023-04-05 DIAGNOSIS — I872 Venous insufficiency (chronic) (peripheral): Secondary | ICD-10-CM | POA: Diagnosis not present

## 2023-04-05 DIAGNOSIS — N1831 Chronic kidney disease, stage 3a: Secondary | ICD-10-CM | POA: Diagnosis not present

## 2023-04-05 DIAGNOSIS — G603 Idiopathic progressive neuropathy: Secondary | ICD-10-CM | POA: Diagnosis not present

## 2023-04-05 DIAGNOSIS — F1190 Opioid use, unspecified, uncomplicated: Secondary | ICD-10-CM | POA: Diagnosis not present

## 2023-04-05 DIAGNOSIS — M797 Fibromyalgia: Secondary | ICD-10-CM | POA: Diagnosis not present

## 2023-04-26 ENCOUNTER — Encounter: Payer: Self-pay | Admitting: Gastroenterology

## 2023-04-26 ENCOUNTER — Ambulatory Visit: Payer: Medicare PPO | Admitting: Gastroenterology

## 2023-04-26 VITALS — BP 108/66 | HR 68 | Ht 64.0 in | Wt 126.0 lb

## 2023-04-26 DIAGNOSIS — K580 Irritable bowel syndrome with diarrhea: Secondary | ICD-10-CM

## 2023-04-26 DIAGNOSIS — R140 Abdominal distension (gaseous): Secondary | ICD-10-CM | POA: Insufficient documentation

## 2023-04-26 DIAGNOSIS — R143 Flatulence: Secondary | ICD-10-CM

## 2023-04-26 MED ORDER — HYOSCYAMINE SULFATE 0.125 MG SL SUBL: 0.1250 mg | SUBLINGUAL_TABLET | Freq: Four times a day (QID) | SUBLINGUAL | 1 refills | Status: DC | PRN

## 2023-04-26 MED ORDER — RIFAXIMIN 550 MG PO TABS: 550.0000 mg | ORAL_TABLET | Freq: Three times a day (TID) | ORAL | 0 refills | Status: AC

## 2023-04-26 NOTE — Progress Notes (Signed)
Assessment and plans Noted.  Suspect opioid related gut disturbance complicated by anxiety/depression.

## 2023-04-26 NOTE — Progress Notes (Signed)
04/26/2023 Ashlee Hooper [id number] 05/18/1956   HISTORY OF PRESENT ILLNESS: This is a 67 year old female who is a patient of Dr. Lamar Sprinkles, assigned him when he was seen by one of our other PAs back in March 2019.  Prior to being seen here she had GI history with Dr. Kinnie Scales remotely.   Patient has history of fibromyalgia, opioid dependence secondary to chronic pain, chronic fatigue ,anxiety, PTSD, and IBS.  Colonoscopy Was done in 2002 revealing grade 2 internal hemorrhoids and was otherwise negative.  She is very tearful today when talking about this chronic dizziness that she has and she has been to several places and they have not been able to figure out what is causing it or what they can do about it.  She says that starting in March her IBS flared up and she has been having a lot of abdominal distention and gas.  She has been passing the gas, but feels that it is excessive and embarrassing, lots of noise in her abdomen.  She is using MiraLAX daily and that helps her have bowel movements, but lately it has been like brown water.  She describes abdominal pain.  Looks like in 2019 to have prescribed Xifaxan, but they do not recall ever getting that and taking it so unsure if it may be was not affordable at that time.  She did recall taking the IBgard that we had recommended and she says that that did not help at all.  Currently she is using a lot of Gas-X.  She is on a very strict diet, does not eat gluten or dairy as well as several other gas producing fruits and vegetables, etc.  She says that she is just in pain everywhere all the time.  No heartburn or reflux symptoms.  She had told Amy Esterwood, PA-C, previously that she was not sure if she ever wanted to have another colonoscopy and that she was in the process of doing a Cologuard.  Looks like there were several orders for Cologuards but has never been performed.  We did not address that today.   Past Medical History:  Diagnosis  Date   Anxiety    Backache, unspecified    Chronic fatigue    Dizziness and giddiness 04/28/2014   Fibromyalgia    Headache(784.0) 04/28/2014   IBS (irritable bowel syndrome)    Insomnia    Menopause    Migraine    Mixed hyperlipidemia    Peripheral neuropathy    Seizure (HCC)    Venous insufficiency    Vertigo    Past Surgical History:  Procedure Laterality Date   APPENDECTOMY     CATARACT EXTRACTION  2017   COLONOSCOPY     HEMORRHOID SURGERY     LAPAROSCOPY     TONSILLECTOMY     TOTAL ABDOMINAL HYSTERECTOMY W/ BILATERAL SALPINGOOPHORECTOMY      reports that she has quit smoking. Her smoking use included cigarettes. She has a 30 pack-year smoking history. She has never used smokeless tobacco. She reports that she does not drink alcohol and does not use drugs. family history includes Alcohol abuse in her father; Cancer in her paternal aunt; Cirrhosis (age of onset: 36) in her father; Fibromyalgia in her sister. Allergies  Allergen Reactions   Sulfa Antibiotics Nausea And Vomiting   Bactrim [Sulfamethoxazole-Trimethoprim]    Codeine Sulfate    Penicillins    Wellbutrin [Bupropion] Other (See Comments)   Zoloft [Sertraline Hcl] Other (See Comments)  Outpatient Encounter Medications as of 04/26/2023  Medication Sig   atorvastatin (LIPITOR) 40 MG tablet Take 40 mg by mouth daily.   Cholecalciferol (VITAMIN D3) 2000 UNITS TABS Take 1 tablet by mouth daily.   Coenzyme Q10 (COQ10) 30 MG CAPS Take 60 mg by mouth daily.   estradiol (VIVELLE-DOT) 0.1 MG/24HR patch Place 1 patch onto the skin 2 (two) times a week.   L-Lysine 500 MG CAPS Take 1 capsule by mouth daily.   LORazepam (ATIVAN) 1 MG tablet Take 1 mg by mouth every 8 (eight) hours. 1 in the am 3 before bedtime   MAGNESIUM PO Take 2 tablets by mouth daily.   Omega-3 Fatty Acids (FISH OIL) 1000 MG CAPS Take 2 capsules by mouth daily.   ondansetron (ZOFRAN) 4 MG tablet TAKE 1 TABLET BY MOUTH EVERY 8 HOURS AS NEEDED FOR  NAUSEA AND VOMITING   oxycodone (ROXICODONE) 30 MG immediate release tablet Take 30 mg by mouth every 8 (eight) hours as needed for pain.   OXYCONTIN 40 MG T12A 12 hr tablet Take 40 mg by mouth daily.   polyethylene glycol (MIRALAX / GLYCOLAX) packet Take 527 g by mouth daily.   promethazine (PHENERGAN) 25 MG suppository _insert 1 SUPPOSITORY RECTALLY 3 TIMES A DAY AS NEEDED for 30 days   SUMAtriptan (IMITREX) 20 MG/ACT nasal spray Place 20 mg into the nose every 2 (two) hours as needed for migraine or headache. May repeat in 2 hours if headache persists or recurs.   triamterene-hydrochlorothiazide (MAXZIDE-25) 37.5-25 MG per tablet Take 1 tablet by mouth daily.   valACYclovir (VALTREX) 1000 MG tablet Take 1,000 mg by mouth as needed.    vitamin B-12 (CYANOCOBALAMIN) 1000 MCG tablet Take 1,000 mcg by mouth daily.   vitamin C (ASCORBIC ACID) 500 MG tablet Take 500 mg by mouth daily.   vitamin E 400 UNIT capsule Take 400 Units by mouth daily.   pravastatin (PRAVACHOL) 40 MG tablet Take 40 mg by mouth daily. (Patient not taking: Reported on 04/26/2023)   tiZANidine (ZANAFLEX) 4 MG tablet TAKE 1 TABLET BY MOUTH FOUR TIMES A DAY AS NEEDED (Patient not taking: Reported on 04/26/2023)   No facility-administered encounter medications on file as of 04/26/2023.    REVIEW OF SYSTEMS  : All other systems reviewed and negative except where noted in the History of Present Illness.   PHYSICAL EXAM: BP 108/66   Pulse 68   Ht 5\' 4"  (1.626 m)   Wt 126 lb (57.2 kg)   SpO2 95%   BMI 21.63 kg/m  General: Well developed white female in no acute distress Head: Normocephalic and atraumatic Eyes:  Sclerae anicteric, conjunctiva pink. Ears: Normal auditory acuity Lungs: Clear throughout to auscultation; no W/R/R. Heart: Regular rate and rhythm; no M/R/G. Abdomen: Soft, non-distended.  BS present.  Mild LLQ TTP. Musculoskeletal: Symmetrical with no gross deformities  Skin: No lesions on visible  extremities Extremities: No edema  Neurological: Alert oriented x 4, grossly non-focal Psychological:  Alert and cooperative. Normal mood and affect  ASSESSMENT AND PLAN: *67 year old female with IBS with acute worsening of her symptoms and extreme gas and bloating over the past several months.  Will try Xifaxan 550 mg 3 times daily x 14 days.  Will try Levsin for abdominal spasm and cramping.  Prescriptions sent to pharmacy.  She will follow-up in about 3 months, but certainly should call or let us know if the Xifaxan has allowed improvement in her symptoms in the next 3-4 weeks. *Fibromyalgia  with chronic pain syndrome and opioid dependence, chronic fatigue syndrome   CC:  Georgianne Fick, MD

## 2023-04-26 NOTE — Patient Instructions (Addendum)
We have sent the following medications to your pharmacy for you to pick up at your convenience: Xifaxan 550 mg three times daily for 14 days.  Levsin 0.125 mg SL every 6-8 hours as needed.  _______________________________________________________  If your blood pressure at your visit was 140/90 or greater, please contact your primary care physician to follow up on this.  _______________________________________________________  If you are age 67 or older, your body mass index should be between 23-30. Your Body mass index is 21.63 kg/m. If this is out of the aforementioned range listed, please consider follow up with your Primary Care Provider.  If you are age 61 or younger, your body mass index should be between 19-25. Your Body mass index is 21.63 kg/m. If this is out of the aformentioned range listed, please consider follow up with your Primary Care Provider.   ________________________________________________________  The [place] [facility] providers would like to encourage you to use [facility] to communicate with providers for non-urgent requests or questions.  Due to long hold times on the telephone, sending your provider a message by [facility] may be a faster and more efficient way to get a response.  Please allow 48 business hours for a response.  Please remember that this is for non-urgent requests.  _______________________________________________________

## 2023-06-07 ENCOUNTER — Telehealth: Payer: Self-pay | Admitting: Pharmacy Technician

## 2023-06-07 ENCOUNTER — Other Ambulatory Visit (HOSPITAL_COMMUNITY): Payer: Self-pay

## 2023-06-07 NOTE — Telephone Encounter (Signed)
Pharmacy Patient Advocate Encounter  Received notification from [facility] that Prior Authorization for [facility] 550MG  has been APPROVED from 1.1.24 to 12.31.25. Ran test claim, Copay is $100. This test claim was processed through [facility]- copay amounts may vary at other pharmacies due to pharmacy/plan contracts, or as the patient moves through the different stages of their insurance plan.   PA #/Case ID/Reference #: [id number]

## 2023-07-27 ENCOUNTER — Ambulatory Visit: Payer: Medicare PPO | Admitting: Internal Medicine

## 2023-09-03 DIAGNOSIS — M159 Polyosteoarthritis, unspecified: Secondary | ICD-10-CM | POA: Diagnosis not present

## 2023-09-03 DIAGNOSIS — M797 Fibromyalgia: Secondary | ICD-10-CM | POA: Diagnosis not present

## 2023-09-03 DIAGNOSIS — I700 Atherosclerosis of aorta: Secondary | ICD-10-CM | POA: Diagnosis not present

## 2023-09-03 DIAGNOSIS — N1831 Chronic kidney disease, stage 3a: Secondary | ICD-10-CM | POA: Diagnosis not present

## 2023-09-03 DIAGNOSIS — G603 Idiopathic progressive neuropathy: Secondary | ICD-10-CM | POA: Diagnosis not present

## 2023-09-03 DIAGNOSIS — E782 Mixed hyperlipidemia: Secondary | ICD-10-CM | POA: Diagnosis not present

## 2023-09-03 DIAGNOSIS — I10 Essential (primary) hypertension: Secondary | ICD-10-CM | POA: Diagnosis not present

## 2023-09-03 DIAGNOSIS — F1190 Opioid use, unspecified, uncomplicated: Secondary | ICD-10-CM | POA: Diagnosis not present

## 2023-09-03 DIAGNOSIS — I872 Venous insufficiency (chronic) (peripheral): Secondary | ICD-10-CM | POA: Diagnosis not present

## 2023-11-29 ENCOUNTER — Other Ambulatory Visit: Payer: Self-pay

## 2023-11-29 MED ORDER — OXYCONTIN 40 MG PO T12A
40.0000 mg | EXTENDED_RELEASE_TABLET | Freq: Two times a day (BID) | ORAL | 0 refills | Status: DC
  Filled 2023-11-29: qty 60, 30d supply, fill #0

## 2023-11-30 ENCOUNTER — Other Ambulatory Visit: Payer: Self-pay

## 2023-12-27 ENCOUNTER — Other Ambulatory Visit: Payer: Self-pay

## 2023-12-27 DIAGNOSIS — M159 Polyosteoarthritis, unspecified: Secondary | ICD-10-CM | POA: Diagnosis not present

## 2023-12-27 DIAGNOSIS — R5383 Other fatigue: Secondary | ICD-10-CM | POA: Diagnosis not present

## 2023-12-27 DIAGNOSIS — N1831 Chronic kidney disease, stage 3a: Secondary | ICD-10-CM | POA: Diagnosis not present

## 2023-12-27 DIAGNOSIS — F1190 Opioid use, unspecified, uncomplicated: Secondary | ICD-10-CM | POA: Diagnosis not present

## 2023-12-27 DIAGNOSIS — I10 Essential (primary) hypertension: Secondary | ICD-10-CM | POA: Diagnosis not present

## 2023-12-27 DIAGNOSIS — E782 Mixed hyperlipidemia: Secondary | ICD-10-CM | POA: Diagnosis not present

## 2023-12-27 MED ORDER — OXYCODONE HCL 30 MG PO TABS
30.0000 mg | ORAL_TABLET | Freq: Three times a day (TID) | ORAL | 0 refills | Status: DC
  Filled 2023-12-27 – 2023-12-28 (×2): qty 90, 30d supply, fill #0

## 2023-12-27 MED ORDER — OXYCONTIN 40 MG PO T12A
40.0000 mg | EXTENDED_RELEASE_TABLET | Freq: Two times a day (BID) | ORAL | 0 refills | Status: DC
  Filled 2023-12-27 – 2023-12-28 (×2): qty 60, 30d supply, fill #0

## 2023-12-28 ENCOUNTER — Other Ambulatory Visit: Payer: Self-pay

## 2024-01-03 DIAGNOSIS — M797 Fibromyalgia: Secondary | ICD-10-CM | POA: Diagnosis not present

## 2024-01-03 DIAGNOSIS — M159 Polyosteoarthritis, unspecified: Secondary | ICD-10-CM | POA: Diagnosis not present

## 2024-01-03 DIAGNOSIS — E782 Mixed hyperlipidemia: Secondary | ICD-10-CM | POA: Diagnosis not present

## 2024-01-03 DIAGNOSIS — N1831 Chronic kidney disease, stage 3a: Secondary | ICD-10-CM | POA: Diagnosis not present

## 2024-01-03 DIAGNOSIS — I700 Atherosclerosis of aorta: Secondary | ICD-10-CM | POA: Diagnosis not present

## 2024-01-03 DIAGNOSIS — G603 Idiopathic progressive neuropathy: Secondary | ICD-10-CM | POA: Diagnosis not present

## 2024-01-03 DIAGNOSIS — E7841 Elevated Lipoprotein(a): Secondary | ICD-10-CM | POA: Diagnosis not present

## 2024-01-03 DIAGNOSIS — I10 Essential (primary) hypertension: Secondary | ICD-10-CM | POA: Diagnosis not present

## 2024-01-03 DIAGNOSIS — Z0000 Encounter for general adult medical examination without abnormal findings: Secondary | ICD-10-CM | POA: Diagnosis not present

## 2024-01-09 ENCOUNTER — Other Ambulatory Visit: Payer: Self-pay | Admitting: Internal Medicine

## 2024-01-09 DIAGNOSIS — R7989 Other specified abnormal findings of blood chemistry: Secondary | ICD-10-CM

## 2024-01-23 DIAGNOSIS — R7989 Other specified abnormal findings of blood chemistry: Secondary | ICD-10-CM | POA: Diagnosis not present

## 2024-01-23 DIAGNOSIS — K838 Other specified diseases of biliary tract: Secondary | ICD-10-CM | POA: Diagnosis not present

## 2024-01-28 ENCOUNTER — Other Ambulatory Visit: Payer: Self-pay

## 2024-01-28 MED ORDER — OXYCODONE HCL 30 MG PO TABS
30.0000 mg | ORAL_TABLET | Freq: Three times a day (TID) | ORAL | 0 refills | Status: DC
  Filled 2024-01-28: qty 90, 30d supply, fill #0

## 2024-01-28 MED ORDER — OXYCONTIN 40 MG PO T12A
40.0000 mg | EXTENDED_RELEASE_TABLET | Freq: Two times a day (BID) | ORAL | 0 refills | Status: DC
  Filled 2024-01-28: qty 60, 30d supply, fill #0

## 2024-02-27 ENCOUNTER — Other Ambulatory Visit: Payer: Self-pay

## 2024-02-27 MED ORDER — OXYCONTIN 40 MG PO T12A
40.0000 mg | EXTENDED_RELEASE_TABLET | Freq: Two times a day (BID) | ORAL | 0 refills | Status: DC
  Filled 2024-02-27: qty 60, 30d supply, fill #0

## 2024-02-27 MED ORDER — OXYCODONE HCL 30 MG PO TABS
30.0000 mg | ORAL_TABLET | Freq: Three times a day (TID) | ORAL | 0 refills | Status: DC | PRN
  Filled 2024-02-27: qty 90, 30d supply, fill #0

## 2024-03-19 DIAGNOSIS — Z124 Encounter for screening for malignant neoplasm of cervix: Secondary | ICD-10-CM | POA: Diagnosis not present

## 2024-03-19 DIAGNOSIS — Z1272 Encounter for screening for malignant neoplasm of vagina: Secondary | ICD-10-CM | POA: Diagnosis not present

## 2024-03-19 DIAGNOSIS — Z6820 Body mass index (BMI) 20.0-20.9, adult: Secondary | ICD-10-CM | POA: Diagnosis not present

## 2024-03-27 ENCOUNTER — Other Ambulatory Visit: Payer: Self-pay

## 2024-03-27 MED ORDER — OXYCONTIN 40 MG PO T12A
40.0000 mg | EXTENDED_RELEASE_TABLET | Freq: Two times a day (BID) | ORAL | 0 refills | Status: DC
  Filled 2024-03-27: qty 60, 30d supply, fill #0

## 2024-03-27 MED ORDER — OXYCODONE HCL 30 MG PO TABS
30.0000 mg | ORAL_TABLET | Freq: Three times a day (TID) | ORAL | 0 refills | Status: DC
  Filled 2024-03-27: qty 90, 30d supply, fill #0

## 2024-04-22 ENCOUNTER — Ambulatory Visit: Admitting: Nurse Practitioner

## 2024-04-22 ENCOUNTER — Encounter: Payer: Self-pay | Admitting: Nurse Practitioner

## 2024-04-22 ENCOUNTER — Other Ambulatory Visit (INDEPENDENT_AMBULATORY_CARE_PROVIDER_SITE_OTHER)

## 2024-04-22 VITALS — BP 122/70 | HR 64 | Ht 62.0 in | Wt 113.1 lb

## 2024-04-22 DIAGNOSIS — R7989 Other specified abnormal findings of blood chemistry: Secondary | ICD-10-CM

## 2024-04-22 LAB — BASIC METABOLIC PANEL WITH GFR
BUN: 20 mg/dL (ref 6–23)
CO2: 33 meq/L — ABNORMAL HIGH (ref 19–32)
Calcium: 9.9 mg/dL (ref 8.4–10.5)
Chloride: 98 meq/L (ref 96–112)
Creatinine, Ser: 1.03 mg/dL (ref 0.40–1.20)
GFR: 56.12 mL/min — ABNORMAL LOW (ref >60.00)
Glucose, Bld: 90 mg/dL (ref 70–99)
Potassium: 3.6 meq/L (ref 3.5–5.1)
Sodium: 138 meq/L (ref 135–145)

## 2024-04-22 LAB — CBC WITH DIFFERENTIAL/PLATELET
Basophils Absolute: 0 K/uL (ref 0.0–0.1)
Basophils Relative: 0.5 % (ref 0.0–3.0)
Eosinophils Absolute: 0 K/uL (ref 0.0–0.7)
Eosinophils Relative: 0.6 % (ref 0.0–5.0)
HCT: 43 % (ref 36.0–46.0)
Hemoglobin: 14.2 g/dL (ref 12.0–15.0)
Lymphocytes Relative: 21.3 % (ref 12.0–46.0)
Lymphs Abs: 1.8 K/uL (ref 0.7–4.0)
MCHC: 33 g/dL (ref 30.0–36.0)
MCV: 96.1 fl (ref 78.0–100.0)
Monocytes Absolute: 0.6 K/uL (ref 0.1–1.0)
Monocytes Relative: 7.2 % (ref 3.0–12.0)
Neutro Abs: 5.8 K/uL (ref 1.4–7.7)
Neutrophils Relative %: 70.4 % (ref 43.0–77.0)
Platelets: 224 K/uL (ref 150.0–400.0)
RBC: 4.47 Mil/uL (ref 3.87–5.11)
RDW: 13.1 % (ref 11.5–15.5)
WBC: 8.3 K/uL (ref 4.0–10.5)

## 2024-04-22 LAB — HEPATIC FUNCTION PANEL
ALT: 30 U/L (ref 0–35)
AST: 22 U/L (ref 0–37)
Albumin: 4.4 g/dL (ref 3.5–5.2)
Alkaline Phosphatase: 56 U/L (ref 39–117)
Bilirubin, Direct: 0.1 mg/dL (ref 0.0–0.3)
Total Bilirubin: 0.6 mg/dL (ref 0.2–1.2)
Total Protein: 7.1 g/dL (ref 6.0–8.3)

## 2024-04-22 NOTE — Progress Notes (Signed)
 04/22/2024 Ashlee Hooper [id number] 1956/04/06   Chief Complaint: Elevated LFTs, dilated common bile duct  History of Present Illness: Past medical history of anxiety, PTSD, fibromyalgia, opioid dependence secondary to chronic pain and IBS. She is known by Dr. Abran. She was last seen in office by Harlene Rand PA-C 04/26/2023 with abdominal gas and bloat. She was prescribed Xifaxan  550 mg 3 times daily for 14 days and Levsin  as needed without significant improvement.  She presents today as referred by Dr. Lequita Flor for further evaluation regarding elevated LFTs and reported common bile duct dilatation per sonogram. She underwent routine laboratory studies done at the time of her annual physical 01/01/2024 which identified elevated LFTs. AST 117 and ALT 130 with normal total bili and alk phos levels. She subsequently underwent a RUQ sonogram which she reported showed dilated common bile duct. Please note, I am unable to access her RUQ sonogram results in EPIC, Care Everywhere or radiology PACS link. Our office contacted the patient's PCPs office to expedite a copy of the RUQ sonogram for my review.  No prior history of liver disease. Father had alcohol associated cirrhosis. She denies having any nausea or vomiting. No upper or lower abdominal pain but endorses having generalized gaseous discomfort and bloat which is chronic. She is passing normal formed stool most days. She recently had intermittent bright red rectal bleeding which occurred for few days then stopped for a few weeks then recurred for 5 days then abated several weeks ago without further recurrence. She underwent a colonoscopy 20 years ago and stated she awakened mid procedure several times and does not wish to ever pursue another colonoscopy. She is on chronic opioids secondary to chronic fibromyalgia associated pain, managed per pain management. No alcohol use. No illicit drug use. She endorses unintentionally losing 10 pounds  over the past 6 months. No fevers. Every once in a while, she has night sweats. Urine is normal yellow, sometimes darker yellow.  She is on Lipitor 40 mg daily for years, no recent change in dosage.   Labs 01/01/2024: T. Bili 0.3. Alk phos 80. AST 117. ALT 130. TSH 2.20. WBC 9.7. Hg 14.6. HCT 46. PLT 254.      Latest Ref Rng & Units 09/26/2017    5:15 PM 07/21/2008    2:25 PM  CBC  WBC 4.0 - 10.5 K/uL 8.5  9.2   Hemoglobin 12.0 - 15.0 g/dL 84.8  85.6   Hematocrit 36.0 - 46.0 % 45.1  43.0   Platelets 150 - 400 K/uL 206  324        Latest Ref Rng & Units 09/26/2017    5:15 PM 07/21/2008    2:25 PM  CMP  Glucose 65 - 99 mg/dL 898  897   BUN 6 - 20 mg/dL 13  6   Creatinine 9.55 - 1.00 mg/dL 8.82  9.05   Sodium 864 - 145 mmol/L 139  140   Potassium 3.5 - 5.1 mmol/L 3.8  4.3   Chloride 101 - 111 mmol/L 101  101   CO2 22 - 32 mmol/L 27  30   Calcium 8.9 - 10.3 mg/dL 9.8  9.2   Total Protein 6.5 - 8.1 g/dL 6.9  6.8   Total Bilirubin 0.3 - 1.2 mg/dL 0.8  0.4   Alkaline Phos 38 - 126 U/L 49  101   AST 15 - 41 U/L 17  22   ALT 14 - 54 U/L 16  20  Current Outpatient Medications on File Prior to Visit  Medication Sig Dispense Refill   atorvastatin (LIPITOR) 40 MG tablet Take 40 mg by mouth daily.     Cholecalciferol (VITAMIN D3) 2000 UNITS TABS Take 1 tablet by mouth daily.     Coenzyme Q10 (COQ10) 30 MG CAPS Take 60 mg by mouth daily.     estradiol (VIVELLE-DOT) 0.1 MG/24HR patch Place 1 patch onto the skin 2 (two) times a week.     L-Lysine 500 MG CAPS Take 1 capsule by mouth daily.     MAGNESIUM PO Take 2 tablets by mouth daily.     Omega-3 Fatty Acids (FISH OIL) 1000 MG CAPS Take 2 capsules by mouth daily.     ondansetron  (ZOFRAN ) 4 MG tablet TAKE 1 TABLET BY MOUTH EVERY 8 HOURS AS NEEDED FOR NAUSEA AND VOMITING 20 tablet 0   oxyCODONE  (OXYCONTIN ) 40 mg 12 hr tablet Take 1 tablet (40 mg total) by mouth every 12 (twelve) hours. 60 tablet 0   oxycodone  (ROXICODONE ) 30 MG immediate  release tablet Take 1 tablet (30 mg total) by mouth 3 (three) times daily. 90 tablet 0   SUMAtriptan (IMITREX) 20 MG/ACT nasal spray Place 20 mg into the nose every 2 (two) hours as needed for migraine or headache. May repeat in 2 hours if headache persists or recurs.     triamterene-hydrochlorothiazide (MAXZIDE-25) 37.5-25 MG per tablet Take 1 tablet by mouth daily.     valACYclovir (VALTREX) 1000 MG tablet Take 1,000 mg by mouth as needed.      vitamin B-12 (CYANOCOBALAMIN) 1000 MCG tablet Take 1,000 mcg by mouth daily.     vitamin C (ASCORBIC ACID) 500 MG tablet Take 500 mg by mouth daily.     vitamin E 400 UNIT capsule Take 400 Units by mouth daily.     No current facility-administered medications on file prior to visit.   Allergies  Allergen Reactions   Sulfa Antibiotics Nausea And Vomiting   Bactrim [Sulfamethoxazole-Trimethoprim]    Codeine Sulfate    Penicillins    Wellbutrin [Bupropion] Other (See Comments)   Zoloft [Sertraline Hcl] Other (See Comments)   Current Medications, Allergies, Past Medical History, Past Surgical History, Family History and Social History were reviewed in [employer] record.  Review of Systems:   Constitutional: See HPI. Respiratory: Negative for shortness of breath.   Cardiovascular: Negative for chest pain, palpitations and leg swelling.  Gastrointestinal: See HPI.  Musculoskeletal: See HPI.  Chronic fibromyalgia pain. Neurological: Negative for dizziness, headaches or paresthesias.   Physical Exam: Ht 5' 2 (1.575 m)   Wt 113 lb 2 oz (51.3 kg)   BMI 20.69 kg/m  Wt Readings from Last 3 Encounters:  04/22/24 113 lb 2 oz (51.3 kg)  04/26/23 126 lb (57.2 kg)  04/07/20 123 lb (55.8 kg)    General: 68 year old female in no acute distress. Head: Normocephalic and atraumatic. Eyes: No scleral icterus. Conjunctiva pink . Ears: Normal auditory acuity. Mouth: Dentition intact. No ulcers or lesions.  Lungs: Clear throughout  to auscultation. Heart: Regular rate and rhythm, no murmur. Abdomen: Soft, nontender and nondistended. No masses or hepatomegaly. Normal bowel sounds x 4 quadrants.  Rectal: Deferred.  Musculoskeletal: Symmetrical with no gross deformities. Extremities: No edema. Neurological: Alert oriented x 4. No focal deficits.  Psychological: Alert and cooperative. Normal mood and affect  Assessment and Recommendations:  68 year old female with elevated AST and ALT levels with normal total bili and alk phos levels 01/01/2024.  LFTs have not been rechecked. RUQ sonogram reportedly showed a dilated CBD, results not available at this time. Patient endorses losing 10 pounds over the past 6 months otherwise asymptomatic. - Hepatic panel, BMP, CBC, hepatitis A total antibody, hepatitis B surface antigen, hepatitis B surface antibody, hepatitis B core total antibody and hepatitis C antibody - I explained to the patient if her repeat LFTs remain elevated she will require further hepatology serologies to rule out autoimmune liver disease, iron overload and ceruloplasmin.  If her LFTs are normal, these additional blood test will not be required. - Request copy of RUQ sonogram - Patient will likely require an abdominal MRI/MRCP if RUQ sonogram showed significant CBD dilatation - Further recommendations to be determined after the above lab results reviewed and prior RUQ sonogram results reviewed - Diet as tolerated  Recent rectal bleeding - Recommended future follow-up appointment to further address rectal bleeding - Patient does not wish to pursue a colonoscopy

## 2024-04-22 NOTE — Progress Notes (Signed)
 Noted

## 2024-04-22 NOTE — Patient Instructions (Signed)
 Your provider has requested that you go to the basement level for lab work before leaving today. Press B on the elevator. The lab is located at the first door on the left as you exit the elevator.  Due to recent changes in healthcare laws, you may see the results of your imaging and laboratory studies on MyChart before your provider has had a chance to review them.  We understand that in some cases there may be results that are confusing or concerning to you. Not all laboratory results come back in the same time frame and the provider may be waiting for multiple results in order to interpret others.  Please give us  48 hours in order for your provider to thoroughly review all the results before contacting the office for clarification of your results.   Further evaluation to be determined after lab results and abdominal sonogram results are received from your doctor.  Thank you for trusting me with your gastrointestinal care!   Ashlee Hooper, CRNP   _______________________________________________________  If your blood pressure at your visit was 140/90 or greater, please contact your primary care physician to follow up on this.  _______________________________________________________  If you are age 63 or older, your body mass index should be between 23-30. Your Body mass index is 20.69 kg/m. If this is out of the aforementioned range listed, please consider follow up with your Primary Care Provider.  If you are age 68 or younger, your body mass index should be between 19-25. Your Body mass index is 20.69 kg/m. If this is out of the aformentioned range listed, please consider follow up with your Primary Care Provider.   ________________________________________________________  The [place] [facility] providers would like to encourage you to use MYCHART to communicate with providers for non-urgent requests or questions.  Due to long hold times on the telephone, sending your provider a message  by [facility] may be a faster and more efficient way to get a response.  Please allow 48 business hours for a response.  Please remember that this is for non-urgent requests.  _______________________________________________________  [facility] is using a team-based approach to care.  Your team is made up of your doctor and two to three APPS. Our APPS (Nurse Practitioners and Physician Assistants) work with your physician to ensure care continuity for you. They are fully qualified to address your health concerns and develop a treatment plan. They communicate directly with your gastroenterologist to care for you. Seeing the Advanced Practice Practitioners on your physician's team can help you by facilitating care more promptly, often allowing for earlier appointments, access to diagnostic testing, procedures, and other specialty referrals.

## 2024-04-23 ENCOUNTER — Ambulatory Visit: Payer: Self-pay | Admitting: Nurse Practitioner

## 2024-04-23 LAB — HEPATITIS B SURFACE ANTIGEN: Hepatitis B Surface Ag: NONREACTIVE

## 2024-04-23 LAB — HEPATITIS B CORE ANTIBODY, TOTAL
Hep B Core Total Ab: NONREACTIVE
Hep B Core Total Ab: NONREACTIVE

## 2024-04-23 LAB — HEPATITIS A ANTIBODY, TOTAL: Hepatitis A AB,Total: NONREACTIVE

## 2024-04-23 LAB — HEPATITIS C ANTIBODY: Hepatitis C Ab: NONREACTIVE

## 2024-04-24 ENCOUNTER — Telehealth: Payer: Self-pay | Admitting: *Deleted

## 2024-04-24 DIAGNOSIS — M159 Polyosteoarthritis, unspecified: Secondary | ICD-10-CM | POA: Diagnosis not present

## 2024-04-24 DIAGNOSIS — E7841 Elevated Lipoprotein(a): Secondary | ICD-10-CM | POA: Diagnosis not present

## 2024-04-24 DIAGNOSIS — G603 Idiopathic progressive neuropathy: Secondary | ICD-10-CM | POA: Diagnosis not present

## 2024-04-24 DIAGNOSIS — E782 Mixed hyperlipidemia: Secondary | ICD-10-CM | POA: Diagnosis not present

## 2024-04-24 DIAGNOSIS — N1831 Chronic kidney disease, stage 3a: Secondary | ICD-10-CM | POA: Diagnosis not present

## 2024-04-24 DIAGNOSIS — K838 Other specified diseases of biliary tract: Secondary | ICD-10-CM

## 2024-04-24 DIAGNOSIS — M797 Fibromyalgia: Secondary | ICD-10-CM | POA: Diagnosis not present

## 2024-04-24 DIAGNOSIS — I700 Atherosclerosis of aorta: Secondary | ICD-10-CM | POA: Diagnosis not present

## 2024-04-24 DIAGNOSIS — I10 Essential (primary) hypertension: Secondary | ICD-10-CM | POA: Diagnosis not present

## 2024-04-24 DIAGNOSIS — R7989 Other specified abnormal findings of blood chemistry: Secondary | ICD-10-CM | POA: Diagnosis not present

## 2024-04-24 NOTE — Telephone Encounter (Signed)
-----   Message from Elida CHRISTELLA Shawl sent at 04/24/2024  1:58 PM EDT ----- Dottie/Linda, please contact the patient and let her know I received her abdominal sonogram results dated 01/23/2024 which confirmed a dilated common bile duct measuring 1.35 cm.  Her repeat liver enzymes/hepatic panel remain normal.  Please schedule the patient for an abdominal MRI/MRCP to further evaluate dilated CBD.  Thank you.

## 2024-04-24 NOTE — Telephone Encounter (Signed)
 Patient has been scheduled for MRCP at [facility] on 9/17 at 10:00 am, 930 am arrival. NPO after 6:00 am morning of test.   Contacted patient and spoke briefly by phone. When attempting to discuss testing, she stopped me to tell me she was on her way out the door for another doctors appointment and could not talk about scheduling right now. Advised I will call back tomorrow. Patient then asked me when she had been scheduled at which point I discussed 9/17 at 10 and patient states ugh uh I can't do that. I again advised we could discuss further tomorrow if she needed to be at another appointment currently.

## 2024-04-25 NOTE — Telephone Encounter (Signed)
 Left message for patient to call back

## 2024-04-28 ENCOUNTER — Other Ambulatory Visit: Payer: Self-pay

## 2024-04-28 MED ORDER — OXYCONTIN 40 MG PO T12A
40.0000 mg | EXTENDED_RELEASE_TABLET | Freq: Two times a day (BID) | ORAL | 0 refills | Status: DC
  Filled 2024-04-28 (×5): qty 60, 30d supply, fill #0

## 2024-04-28 MED ORDER — OXYCODONE HCL 30 MG PO TABS
30.0000 mg | ORAL_TABLET | Freq: Three times a day (TID) | ORAL | 0 refills | Status: AC
  Filled 2024-04-28: qty 90, 30d supply, fill #0

## 2024-04-28 NOTE — Telephone Encounter (Signed)
 I have spoken to patient and rescheduled her MRCP to Friday, 05/02/24 at 9 am, 830 am arrival at [facility], NPO 4 hours prior. Patient verbalizes understanding. She has also been given the number to radiology scheduling should she need to reschedule again.

## 2024-04-30 ENCOUNTER — Ambulatory Visit (HOSPITAL_COMMUNITY)

## 2024-05-02 ENCOUNTER — Other Ambulatory Visit: Payer: Self-pay | Admitting: Nurse Practitioner

## 2024-05-02 ENCOUNTER — Ambulatory Visit (HOSPITAL_COMMUNITY)
Admission: RE | Admit: 2024-05-02 | Discharge: 2024-05-02 | Disposition: A | Discharge status: Home / Self Care | Source: Ambulatory Visit | Attending: Nurse Practitioner | Admitting: Nurse Practitioner

## 2024-05-02 DIAGNOSIS — N281 Cyst of kidney, acquired: Secondary | ICD-10-CM | POA: Diagnosis not present

## 2024-05-02 DIAGNOSIS — K838 Other specified diseases of biliary tract: Secondary | ICD-10-CM

## 2024-05-02 DIAGNOSIS — K8689 Other specified diseases of pancreas: Secondary | ICD-10-CM | POA: Diagnosis not present

## 2024-05-04 ENCOUNTER — Ambulatory Visit: Payer: Self-pay | Admitting: Nurse Practitioner

## 2024-05-08 ENCOUNTER — Ambulatory Visit: Payer: Self-pay | Admitting: Nurse Practitioner

## 2024-05-14 ENCOUNTER — Other Ambulatory Visit: Payer: Self-pay

## 2024-05-14 DIAGNOSIS — R7989 Other specified abnormal findings of blood chemistry: Secondary | ICD-10-CM

## 2024-05-14 DIAGNOSIS — K838 Other specified diseases of biliary tract: Secondary | ICD-10-CM

## 2024-05-14 NOTE — Progress Notes (Signed)
 EUS has been set up for 07/03/24 at 1230 pm at [facility] with GM

## 2024-05-21 NOTE — Telephone Encounter (Signed)
 Linda and Dr. Abran, Pls review multiple messages below which document my attempts to contact the patient at her requested phone number [phone]. Per phone message 05/08/2024 below, I explained her MRI/MRCP results in full detail with the patient and discussed Dr. Nancyann recommendations to repeat an abdominal MRI/MRCP with and without contrast in 1 year. I also explained to the patient that our biliary specialist was going to review her MRI/MRCP results and he would provide his recommendations regarding a possible future EUS.   Per subsequent messages as noted below, I did attempt to contact the patient per her request to discuss the EUS, however, I called her on my day off work  therefore I  blocked my cell phone by calling [phone] and an automated message stated this party does not accept blocked calls therefore I did not reach the patient at that time, hence I sent the patient a message informing the patient that  I tried to call her but did not reach her. That message also informed the patient that I would be back in the office until Monday 05/26/2024 and I would call her when I return.   I saw today's message from the patient ( I am still off work) and again I attempted to call her at [phone] and again an automatic message stated this  party does not accept blocked calls.

## 2024-05-21 NOTE — Telephone Encounter (Signed)
 Ashlee Hooper, adding to this message as well.

## 2024-05-26 NOTE — Telephone Encounter (Signed)
 I attempted to call the patient at this time to address her questions as noted in the multiple messages below, however, she did not answer her phone. I left a message on her voicemail to inform her that I would try to call her later today or tomorrow.

## 2024-05-26 NOTE — Telephone Encounter (Signed)
 I will contact patient in 2 weeks if she has not provided an update/decision by that time.

## 2024-05-26 NOTE — Telephone Encounter (Signed)
 I spoke to patient on phone, had a productive 16 minute phone conversation. We re-reviewed her abdominal MRI/MRCP findings. I addressed all of her questions to the best of my ability. I explained the EUS was to evaluate the lower CBD and the ampulla of vater area, to rule out any inflammatory process, distal CBD or PD stricture and to rule out any precancerous or cancerous process.  She is currently scheduled for an EUS with Dr. Wilhelmenia on 07/03/2024. At this juncture the patient stated she is not sure she wants to proceed with an EUS, she will think it over and she will let us  know what she decides.   If she proceed with the EUS, she wanted you to know the following: I don't like hospitals I don't like IVs and as soon as an IV is placed I need to be heavily sedated, I might try to pull out the IV I awakened 3 times during a colonoscopy and that's why I don't ever want to have another colonoscopy  She does not want to have a follow up abdominal MRI/MRCP in one year, stated she had a  bad experience at the time of her MRI/MRCP, the call bell did not work

## 2024-05-26 NOTE — Telephone Encounter (Signed)
 Understood. I just need someone to give me a update within the next couple of weeks as to what she agrees or does not agree with. Otherwise I need to give the slot to another patient. Thanks. GM

## 2024-05-28 ENCOUNTER — Other Ambulatory Visit: Payer: Self-pay

## 2024-05-28 MED ORDER — OXYCONTIN 40 MG PO T12A
40.0000 mg | EXTENDED_RELEASE_TABLET | Freq: Two times a day (BID) | ORAL | 0 refills | Status: DC
  Filled 2024-05-28: qty 60, 30d supply, fill #0

## 2024-05-28 MED ORDER — OXYCODONE HCL 30 MG PO TABS
30.0000 mg | ORAL_TABLET | Freq: Three times a day (TID) | ORAL | 0 refills | Status: DC | PRN
  Filled 2024-05-28: qty 90, 30d supply, fill #0

## 2024-05-28 NOTE — Telephone Encounter (Signed)
Sounds good. GM

## 2024-06-13 ENCOUNTER — Telehealth: Payer: Self-pay | Admitting: Nurse Practitioner

## 2024-06-13 NOTE — Telephone Encounter (Signed)
 I called patient today per preferred home # [phone], I did not reach her directly. I left a detailed message on her voicemail regarding our prior phone communication 05/26/2024 regarding EUS scheduled with Dr. Wilhelmenia 07/03/2024. At the time of that phone call, patient stated she wanted to think about having the EUS and would contact me in 2 weeks to verify her decision. I have not heard from her since then.   I will attempt to contact patient on Monday 06/16/2024 to further discuss her intentions.

## 2024-06-17 NOTE — Telephone Encounter (Signed)
 Dr. Wilhelmenia, refer to phone note 05/26/2024. I called patient today and spoke with her directly. She stated she intends to proceed with the EUS at [facility] as scheduled with you 07/03/2024.   Ashlee Hooper FYI

## 2024-06-25 ENCOUNTER — Telehealth: Payer: Self-pay | Admitting: Gastroenterology

## 2024-06-25 NOTE — Telephone Encounter (Addendum)
 Procedure:Upper EUS Procedure date: 07/03/24 Procedure location: [facility] Arrival Time: 9:15 am Spoke with the patient Y/N:   No, I left a detailed message on [phone] on 06/25/24 @ 1:36 pm for the patient to return call  Yes 06/26/24 @ 10:45 am When I called the patient she stated that she did not want to talked to me and I asked Why not, don yo not want to have the procedure she stated  no, I want the procedure but you talked to me so nasty yesterday. I stated ma'am that was not me, you did not talk to me yesterday. She kept insisting that it was me that she talked to, and I said no, all I did was leave a message on you phone for you to call back. She stated that she did call back and talk to someone ( again she said me) and they were rude. She stated that I  was upset and stated I had a high call volume. I told her that that was not me because I don't answer the phones or have a high call volume. She still insisted that it was me. Then she finally asked then what did I want, I told her just to remind her of her procedure, place, date and time. She then stated someone had given her a different time. I told that I was sorry that she had that experience but the person that she talked to wasn't me. My coworkers Sonny, Nestor and Manton herd the whole conversation. I saw that Euna had opened the chart/message yesterday.  I then went to the scheduling department and talked with Davida and she stated that it was she that talked to the patient yesterday and that the patient was very rude to her.     Any prep concerns? No  Has the patient obtained the prep from the pharmacy ? No prep needed Do you have a care partner and transportation: Yes Any additional concerns? No

## 2024-06-26 ENCOUNTER — Other Ambulatory Visit: Payer: Self-pay

## 2024-06-26 ENCOUNTER — Encounter (HOSPITAL_COMMUNITY): Payer: Self-pay | Admitting: Gastroenterology

## 2024-06-26 MED ORDER — OXYCONTIN 40 MG PO T12A
40.0000 mg | EXTENDED_RELEASE_TABLET | Freq: Two times a day (BID) | ORAL | 0 refills | Status: DC
  Filled 2024-06-26: qty 60, 30d supply, fill #0

## 2024-06-26 MED ORDER — OXYCODONE HCL 30 MG PO TABS
30.0000 mg | ORAL_TABLET | Freq: Three times a day (TID) | ORAL | 0 refills | Status: AC | PRN
  Filled 2024-06-26: qty 90, 30d supply, fill #0

## 2024-06-26 NOTE — Telephone Encounter (Signed)
 Spoke with patient.    No further questions

## 2024-07-02 ENCOUNTER — Other Ambulatory Visit: Payer: Self-pay

## 2024-07-03 ENCOUNTER — Ambulatory Visit (HOSPITAL_COMMUNITY): Payer: Self-pay | Admitting: Registered Nurse

## 2024-07-03 ENCOUNTER — Encounter (HOSPITAL_COMMUNITY): Admission: RE | Disposition: A | Payer: Self-pay | Source: Home / Self Care | Attending: Gastroenterology

## 2024-07-03 ENCOUNTER — Ambulatory Visit (HOSPITAL_COMMUNITY)
Admission: RE | Admit: 2024-07-03 | Discharge: 2024-07-03 | Disposition: A | Discharge status: Home / Self Care | Attending: Gastroenterology | Admitting: Gastroenterology

## 2024-07-03 ENCOUNTER — Other Ambulatory Visit: Payer: Self-pay

## 2024-07-03 ENCOUNTER — Encounter (HOSPITAL_COMMUNITY): Payer: Self-pay | Admitting: Gastroenterology

## 2024-07-03 DIAGNOSIS — K209 Esophagitis, unspecified without bleeding: Secondary | ICD-10-CM | POA: Diagnosis not present

## 2024-07-03 DIAGNOSIS — K2289 Other specified disease of esophagus: Secondary | ICD-10-CM

## 2024-07-03 DIAGNOSIS — K862 Cyst of pancreas: Secondary | ICD-10-CM | POA: Diagnosis not present

## 2024-07-03 DIAGNOSIS — K2950 Unspecified chronic gastritis without bleeding: Secondary | ICD-10-CM | POA: Diagnosis not present

## 2024-07-03 DIAGNOSIS — K449 Diaphragmatic hernia without obstruction or gangrene: Secondary | ICD-10-CM

## 2024-07-03 DIAGNOSIS — Z87891 Personal history of nicotine dependence: Secondary | ICD-10-CM | POA: Insufficient documentation

## 2024-07-03 DIAGNOSIS — K3189 Other diseases of stomach and duodenum: Secondary | ICD-10-CM

## 2024-07-03 DIAGNOSIS — I899 Noninfective disorder of lymphatic vessels and lymph nodes, unspecified: Secondary | ICD-10-CM

## 2024-07-03 DIAGNOSIS — F419 Anxiety disorder, unspecified: Secondary | ICD-10-CM | POA: Diagnosis not present

## 2024-07-03 DIAGNOSIS — K2970 Gastritis, unspecified, without bleeding: Secondary | ICD-10-CM

## 2024-07-03 DIAGNOSIS — K838 Other specified diseases of biliary tract: Secondary | ICD-10-CM | POA: Insufficient documentation

## 2024-07-03 DIAGNOSIS — S27818A Other injury of esophagus (thoracic part), initial encounter: Secondary | ICD-10-CM | POA: Diagnosis not present

## 2024-07-03 DIAGNOSIS — R7989 Other specified abnormal findings of blood chemistry: Secondary | ICD-10-CM

## 2024-07-03 DIAGNOSIS — K831 Obstruction of bile duct: Secondary | ICD-10-CM

## 2024-07-03 HISTORY — PX: EUS: SHX5427

## 2024-07-03 HISTORY — PX: ESOPHAGOGASTRODUODENOSCOPY: SHX5428

## 2024-07-03 SURGERY — ULTRASOUND, UPPER GI TRACT, ENDOSCOPIC
Anesthesia: Monitor Anesthesia Care

## 2024-07-03 MED ORDER — ONDANSETRON HCL 4 MG/2ML IJ SOLN
INTRAMUSCULAR | Status: DC | PRN
  Administered 2024-07-03: 4 mg via INTRAVENOUS

## 2024-07-03 MED ORDER — PROPOFOL 1000 MG/100ML IV EMUL
INTRAVENOUS | Status: AC
Start: 2024-07-03 — End: 2024-07-03
  Filled 2024-07-03: qty 100

## 2024-07-03 MED ORDER — PANTOPRAZOLE SODIUM 40 MG PO TBEC
40.0000 mg | DELAYED_RELEASE_TABLET | Freq: Every day | ORAL | 6 refills | Status: AC
Start: 2024-07-03 — End: ?

## 2024-07-03 MED ORDER — FENTANYL CITRATE (PF) 100 MCG/2ML IJ SOLN
INTRAMUSCULAR | Status: AC
  Filled 2024-07-03: qty 2

## 2024-07-03 MED ORDER — DEXMEDETOMIDINE HCL IN NACL 80 MCG/20ML IV SOLN
INTRAVENOUS | Status: DC | PRN
  Administered 2024-07-03: 8 ug via INTRAVENOUS
  Administered 2024-07-03: 4 ug via INTRAVENOUS

## 2024-07-03 MED ORDER — EPHEDRINE SULFATE-NACL 50-0.9 MG/10ML-% IV SOSY
PREFILLED_SYRINGE | INTRAVENOUS | Status: DC | PRN
  Administered 2024-07-03: 5 mg via INTRAVENOUS

## 2024-07-03 MED ORDER — FENTANYL CITRATE (PF) 100 MCG/2ML IJ SOLN
INTRAMUSCULAR | Status: DC | PRN
  Administered 2024-07-03: 25 ug via INTRAVENOUS

## 2024-07-03 MED ORDER — SODIUM CHLORIDE 0.9 % IV SOLN: INTRAVENOUS | Status: DC

## 2024-07-03 MED ORDER — PROPOFOL 500 MG/50ML IV EMUL
INTRAVENOUS | Status: DC | PRN
  Administered 2024-07-03: 150 ug/kg/min via INTRAVENOUS
  Administered 2024-07-03: 20 mg via INTRAVENOUS
  Administered 2024-07-03: 40 mg via INTRAVENOUS
  Administered 2024-07-03 (×2): 20 mg via INTRAVENOUS

## 2024-07-03 NOTE — Discharge Instructions (Signed)

## 2024-07-03 NOTE — Anesthesia Preprocedure Evaluation (Addendum)
 Anesthesia Evaluation  Patient identified by MRN, date of birth, ID band Patient awake    Reviewed: Allergy & Precautions, NPO status , Patient's Chart, lab work & pertinent test results  History of Anesthesia Complications Negative for: history of anesthetic complications  Airway Mallampati: II  TM Distance: >3 FB Neck ROM: Full    Dental  (+) Dental Advisory Given, Teeth Intact   Pulmonary former smoker   Pulmonary exam normal        Cardiovascular negative cardio ROS Normal cardiovascular exam     Neuro/Psych  Headaches, Seizures -, Well Controlled,  PSYCHIATRIC DISORDERS Anxiety      Neuromuscular disease    GI/Hepatic negative GI ROS, Neg liver ROS,,,  Endo/Other  negative endocrine ROS    Renal/GU negative Renal ROS     Musculoskeletal  (+)  Fibromyalgia -  Abdominal   Peds  Hematology negative hematology ROS (+)   Anesthesia Other Findings   Reproductive/Obstetrics                              Anesthesia Physical Anesthesia Plan  ASA: 3  Anesthesia Plan: MAC   Post-op Pain Management: Minimal or no pain anticipated   Induction:   PONV Risk Score and Plan: 2 and Propofol  infusion and Treatment may vary due to age or medical condition  Airway Management Planned: Nasal Cannula and Natural Airway  Additional Equipment: None  Intra-op Plan:   Post-operative Plan:   Informed Consent: I have reviewed the patients History and Physical, chart, labs and discussed the procedure including the risks, benefits and alternatives for the proposed anesthesia with the patient or authorized representative who has indicated his/her understanding and acceptance.       Plan Discussed with: CRNA and Anesthesiologist  Anesthesia Plan Comments:          Anesthesia Quick Evaluation

## 2024-07-03 NOTE — Anesthesia Postprocedure Evaluation (Signed)
 Anesthesia Post Note  Patient: Ashlee Hooper  Procedure(s) Performed: ULTRASOUND, UPPER GI TRACT, ENDOSCOPIC EGD (ESOPHAGOGASTRODUODENOSCOPY)     Patient location during evaluation: PACU Anesthesia Type: MAC Level of consciousness: awake and alert Pain management: pain level controlled Vital Signs Assessment: post-procedure vital signs reviewed and stable Respiratory status: spontaneous breathing, nonlabored ventilation and respiratory function stable Cardiovascular status: stable and blood pressure returned to baseline Anesthetic complications: no   No notable events documented.  Last Vitals:  Vitals:   07/03/24 1140 07/03/24 1148  BP: (!) 92/52 (!) 96/58  Pulse: (!) 57 (!) 59  Resp: 13 14  Temp:    SpO2: 100% 98%    Last Pain:  Vitals:   07/03/24 1140  TempSrc:   PainSc: 0-No pain                 Debby FORBES Like

## 2024-07-03 NOTE — Transfer of Care (Signed)
 Immediate Anesthesia Transfer of Care Note  Patient: Ashlee Hooper  Procedure(s) Performed: ULTRASOUND, UPPER GI TRACT, ENDOSCOPIC EGD (ESOPHAGOGASTRODUODENOSCOPY)  Patient Location: PACU and Endoscopy Unit  Anesthesia Type:MAC  Level of Consciousness: awake, alert , oriented, and patient cooperative  Airway & Oxygen Therapy: Patient Spontanous Breathing and Patient connected to face mask oxygen  Post-op Assessment: Report given to RN, Post -op Vital signs reviewed and stable, and Patient moving all extremities  Post vital signs: Reviewed and stable  Last Vitals:  Vitals Value Taken Time  BP 92/45 07/03/24 11:20  Temp    Pulse 55 07/03/24 11:21  Resp 10 07/03/24 11:21  SpO2 100 % 07/03/24 11:21  Vitals shown include unfiled device data.  Last Pain:  Vitals:   07/03/24 1118  TempSrc:   PainSc: 0-No pain         Complications: No notable events documented.

## 2024-07-03 NOTE — Anesthesia Procedure Notes (Signed)
 Procedure Name: MAC Date/Time: 07/03/2024 10:22 AM  Performed by: Memory Armida LABOR, CRNAPre-anesthesia Checklist: Patient identified, Emergency Drugs available, Suction available, Patient being monitored and Timeout performed Patient Re-evaluated:Patient Re-evaluated prior to induction Oxygen Delivery Method: Simple face mask Placement Confirmation: positive ETCO2 Dental Injury: Teeth and Oropharynx as per pre-operative assessment

## 2024-07-03 NOTE — H&P (Signed)
 GASTROENTEROLOGY PROCEDURE H&P NOTE   Primary Care Physician: Verdia Lombard, MD  HPI: Ashlee Hooper is a 68 y.o. female who presents for EGD/EUS to evaluate dilated bile duct.  Past Medical History:  Diagnosis Date   Anxiety    Backache, unspecified    Chronic fatigue    Dizziness and giddiness 04/28/2014   Fibromyalgia    Headache(784.0) 04/28/2014   IBS (irritable bowel syndrome)    Insomnia    Menopause    Migraine    Mixed hyperlipidemia    Peripheral neuropathy    Seizure (HCC)    Venous insufficiency    Vertigo    Past Surgical History:  Procedure Laterality Date   APPENDECTOMY     CATARACT EXTRACTION  2017   CESAREAN SECTION     COLONOSCOPY     HEMORRHOID SURGERY     LAPAROSCOPY     x 3   OVARY SURGERY     TONSILLECTOMY     TOTAL ABDOMINAL HYSTERECTOMY W/ BILATERAL SALPINGOOPHORECTOMY     Current Facility-Administered Medications  Medication Dose Route Frequency Provider Last Rate Last Admin   0.9 %  sodium chloride infusion   Intravenous Continuous Mansouraty, Aloha Raddle., MD        Current Facility-Administered Medications:    0.9 %  sodium chloride infusion, , Intravenous, Continuous, Mansouraty, Aloha Raddle., MD Allergies  Allergen Reactions   Sulfa Antibiotics Nausea And Vomiting   Bactrim [Sulfamethoxazole-Trimethoprim]    Codeine Sulfate    Penicillins    Wellbutrin [Bupropion] Other (See Comments)   Zoloft [Sertraline Hcl] Other (See Comments)   Family History  Problem Relation Age of Onset   Cirrhosis Father 18   Alcohol abuse Father    Fibromyalgia Sister    Cancer Paternal Aunt    Breast cancer Neg Hx    Colon cancer Neg Hx    Esophageal cancer Neg Hx    Stomach cancer Neg Hx    Pancreatic cancer Neg Hx    Social History   Socioeconomic History   Marital status: Married    Spouse name: Not on file   Number of children: 1   Years of education: BA   Highest education level: Not on file  Occupational History    Occupation: disabled   Occupation: runner, broadcasting/film/video  Tobacco Use   Smoking status: Former    Current packs/day: 1.00    Average packs/day: 1 pack/day for 30.0 years (30.0 ttl pk-yrs)    Types: Cigarettes   Smokeless tobacco: Never  Vaping Use   Vaping status: Former  Substance and Sexual Activity   Alcohol use: No   Drug use: No   Sexual activity: Not on file  Other Topics Concern   Not on file  Social History Narrative   Patient drinks 1/2 cup of caffeine  daily.   Patient is right handed.   Social Drivers of Corporate Investment Banker Strain: Not on file  Food Insecurity: Not on file  Transportation Needs: Not on file  Physical Activity: Not on file  Stress: Not on file  Social Connections: Not on file  Intimate Partner Violence: Not on file    Physical Exam: Today's Vitals   06/26/24 1526  Weight: 51 kg   Body mass index is 20.56 kg/m. GEN: NAD EYE: Sclerae anicteric ENT: MMM CV: Non-tachycardic GI: Soft, NT/ND NEURO:  Alert & Oriented x 3  Lab Results: No results for input(s): WBC, HGB, HCT, PLT in the last 72 hours. BMET No results  for input(s): NA, K, CL, CO2, GLUCOSE, BUN, CREATININE, CALCIUM in the last 72 hours. LFT No results for input(s): PROT, ALBUMIN, AST, ALT, ALKPHOS, BILITOT, BILIDIR, IBILI in the last 72 hours. PT/INR No results for input(s): LABPROT, INR in the last 72 hours.   Impression / Plan: This is a 68 y.o.female who presents for EGD/EUS to evaluate dilated bile duct.  The risks of an EUS including intestinal perforation, bleeding, infection, aspiration, and medication effects were discussed as was the possibility it may not give a definitive diagnosis if a biopsy is performed.  When a biopsy of the pancreas is done as part of the EUS, there is an additional risk of pancreatitis at the rate of about 1-2%.  It was explained that procedure related pancreatitis is typically mild, although it can be  severe and even life threatening, which is why we do not perform random pancreatic biopsies and only biopsy a lesion/area we feel is concerning enough to warrant the risk.   The risks and benefits of endoscopic evaluation/treatment were discussed with the patient and/or family; these include but are not limited to the risk of perforation, infection, bleeding, missed lesions, lack of diagnosis, severe illness requiring hospitalization, as well as anesthesia and sedation related illnesses.  The patient's history has been reviewed, patient examined, no change in status, and deemed stable for procedure.  The patient and/or family was provided an opportunity to ask questions and all were answered.  The patient and/or family is agreeable to proceed.    Aloha Finner, MD [facility] Office # [phone]

## 2024-07-03 NOTE — Op Note (Signed)
 [facility] Patient Name: Ashlee Hooper Procedure Date: 07/03/2024 MRN: [id number] Attending MD: Aloha Finner , MD, [id number] Date of Birth: November 28, 1955 CSN: [id number] Age: 69 Admit Type: Outpatient Procedure:                Upper EUS Indications:              Common bile duct dilation (acquired) seen on MRCP Providers:                Aloha Finner, MD, Ozell Pouch, [facility], Technician, Armida LABOR. [place], CRNA Referring MD:             Aloha Finner, MD Medicines:                Monitored Anesthesia Care Complications:            No immediate complications. Estimated Blood Loss:     Estimated blood loss was minimal. Procedure:                Pre-Anesthesia Assessment:                           - Prior to the procedure, a History and Physical                            was performed, and patient medications and                            allergies were reviewed. The patient's tolerance of                            previous anesthesia was also reviewed. The risks                            and benefits of the procedure and the sedation                            options and risks were discussed with the patient.                            All questions were answered, and informed consent                            was obtained. Prior Anticoagulants: The patient has                            taken no anticoagulant or antiplatelet agents. ASA                            Grade Assessment: III - A patient with severe                            systemic disease. After reviewing the risks and  benefits, the patient was deemed in satisfactory                            condition to undergo the procedure.                           After obtaining informed consent, the endoscope was                            passed under direct vision. Throughout the                            procedure, the  patient's blood pressure, pulse, and                            oxygen saturations were monitored continuously. The                            [id number]-[id number] ([id number]) Olympus endoscope was introduced                            through the mouth, and advanced to the second part                            of duodenum. The [id number]-[id number] ([id number]) Olympus                            duodenoscope was introduced through the mouth, and                            advanced to the area of papilla. The [id number]                            ([id number]) Olympus ultrasound scope was introduced                            through the mouth, and advanced to the duodenum for                            ultrasound examination from the esophagus, stomach                            and duodenum. The upper EUS was accomplished                            without difficulty. The patient tolerated the                            procedure. Scope In: Scope Out: Findings:      ENDOSCOPIC FINDING: :      LA Grade A (one or more mucosal breaks less than 5 mm, not extending       between tops of 2 mucosal folds) esophagitis with no bleeding was found       in the distal  esophagus.      No other gross lesions were noted in the rest of the esophagus.      The Z-line was irregular and was found 36 cm from the incisors.      A 2 cm hiatal hernia was present.      Patchy mildly erythematous mucosa was found in the entire examined       stomach. Biopsies were taken with a cold forceps for histology and       Helicobacter pylori testing.      A mild angulation deformity was found in the duodenal sweep. After       traversing this area with the duodenoscope there was a mild mucosal       wrent but no evidence of perforation.      No other gross lesions were noted in the duodenal bulb, in the first       portion of the duodenum and in the second portion of the duodenum.      The major papilla was normal.      After the completion of the  EUS, EGD was replaced and a non-bleeding       mucosal wrent as a result of scope passage was found in the proximal       esophagus. No evidence of perforation noted.      ENDOSONOGRAPHIC FINDING: :      An anechoic lesion suggestive of a cyst was identified in the pancreatic       body/tail transition. It communicates with the main pancreatic duct. The       lesion measured 7 mm by 6 mm in maximal cross-sectional diameter (but       visible that upon scanning was longer in length). There was a single       compartment without septae. The outer wall of the lesion was not seen.       There was no associated mass. There was no internal debris within the       fluid-filled cavity.      There was no sign of significant endosonographic echotexture abnormality       in the pancreatic head, genu of the pancreas, pancreatic body,       pancreatic tail and main pancreatic duct. No masses, no cysts, no       calcifications.      The diameter of the main pancreatic duct (MPD) measured:      - HOP 1.7 mm (head of pancreas)      -- Within the HOP, there appears to almost be a small annular portion of       pancreatic duct (not an ansa).      - NOP - 2.4 mm (neck of pancreas)      - BOP 1.6 mm (body of the pancreas)      - TOP 1.1 -> 0.6 mm (tail of the pancreas).      There was dilation in the common bile duct (1.5 ->5.0 ->11.7) and in the       common hepatic duct (11.7 -> 15.1 mm). No evidence of any       choledocholithiasis.      There was a suggestion of a narrowing possible distal stenosis in the       lower third of the main bile duct. This however tapers accordingly all       the way to the ampulla without any clear mass/lesion.      There was  no sign of significant endosonographic abnormality in the       gallbladder.      Endosonographic imaging of the ampulla showed no intramural       (subepithelial) lesion.      No malignant-appearing lymph nodes were visualized in the celiac region        (level 20), peripancreatic region and porta hepatis region.      Endosonographic imaging in the visualized portion of the liver showed no       mass.      Endosonographic imaging in the thoracic esophagus and in the       gastroesophageal junction showed no intramural (subepithelial) lesion.      The celiac region was visualized.      The esophagus, stomach and duodenum were examined endosonographically. Impression:               EGD Impression:                           - LA Grade A esophagitis with no bleeding found                            distally. No other gross lesions in the entire                            esophagus. Z-line irregular, 36 cm from the                            incisors.                           - 2 cm hiatal hernia.                           - Erythematous mucosa in the stomach. Biopsied.                           - Duodenal angulation deformity in the duodenal                            sweep.                           - No other gross lesions in the duodenal bulb, in                            the first portion of the duodenum and in the second                            portion of the duodenum.                           - Normal major papilla.                           - Incidental mucosal wrent noted in the proximal  esophagus (after passage of all of the scopes) - no                            perforation noted.                           EUS Impression:                           - A cystic lesion was seen in the pancreatic                            body/tail transition. Tissue has not been obtained.                            However, the endosonographic appearance is                            consistent with a branched intraductal papillary                            mucinous neoplasm.                           - There was no sign of significant pathology within                            the echotexture in the  pancreatic head, genu of the                            pancreas, pancreatic body, pancreatic tail and main                            pancreatic duct.                           - Main pancreatic duct (MPD) diameter was measured.                            Endosonographically, the MPD had a normal                            appearance. There appeared however to be a small                            annular pancreatic portion of the very distal                            pancreatic duct within the ventral head (not                            clearly an ansa however).                           - There was dilation in the common bile  duct and in                            the common hepatic duct. No choledocholithiasis                            noted. However, there was a suggestion of a mild                            narrowing in the lower third of the main bile duct                            with smooth tapering to the ampulla.                           - No intramural ampullary lesion noted.                           - There was no sign of significant pathology in the                            gallbladder.                           - No malignant-appearing lymph nodes were                            visualized in the celiac region (level 20),                            peripancreatic region and porta hepatis region. Moderate Sedation:      Not Applicable - Patient had care per Anesthesia. Recommendation:           - The patient will be observed post-procedure,                            until all discharge criteria are met.                           - Discharge patient to home.                           - Patient has a contact number available for                            emergencies. The signs and symptoms of potential                            delayed complications were discussed with the                            patient. Return to normal activities tomorrow.                             Written discharge instructions were  provided to the                            patient.                           - Observe patient's clinical course.                           - Start Pantoprazole 40 mg daily.                           - Continue present medications.                           - Await path results.                           - There is no evidence of a true mass/lesion within                            the [facility] or ampullary process. Query if an ampullary                            stenosis is reason for progressive biliary ductal                            dilation. Although not noted in 2019 CT, looking at                            and measuring the biliary duct at that time                            independently, it does appear that the biliary duct                            has been dilated for years (just not to the degree                            of where it is currently). If there continues to be                            issues with abnormal LFTs, query role of ERCP in                            future with primary GI.                           - Consider repeat imaging MRI/MRCP in 1-year for                            surveillance of BD-IPMN and for biliary dilation  surveillance with primary GI team.                           - The findings and recommendations were discussed                            with the patient.                           - The findings and recommendations were discussed                            with the designated responsible adult. Procedure Code(s):        --- Professional ---                           [phone], Esophagogastroduodenoscopy, flexible,                            transoral; with endoscopic ultrasound examination,                            including the esophagus, stomach, and either the                            duodenum or a surgically altered stomach where the                             jejunum is examined distal to the anastomosis                           [id number], Esophagogastroduodenoscopy, flexible,                            transoral; with biopsy, single or multiple Diagnosis Code(s):        --- Professional ---                           K20.90, Esophagitis, unspecified without bleeding                           K22.89, Other specified disease of esophagus                           K44.9, Diaphragmatic hernia without obstruction or                            gangrene                           K31.89, Other diseases of stomach and duodenum                           S27.818A, Other injury of esophagus (thoracic                            part),  initial encounter                           K86.2, Cyst of pancreas                           K83.8, Other specified diseases of biliary tract                           I89.9, Noninfective disorder of lymphatic vessels                            and lymph nodes, unspecified                           R93.2, Abnormal findings on diagnostic imaging of                            liver and biliary tract CPT copyright 2022 [employer]. All rights reserved. The codes documented in this report are preliminary and upon coder review may  be revised to meet current compliance requirements. Aloha Finner, MD 07/03/2024 11:32:18 AM Number of Addenda: 0

## 2024-07-04 ENCOUNTER — Encounter (HOSPITAL_COMMUNITY): Payer: Self-pay | Admitting: Gastroenterology

## 2024-07-07 ENCOUNTER — Ambulatory Visit: Payer: Self-pay | Admitting: Gastroenterology

## 2024-07-07 LAB — SURGICAL PATHOLOGY

## 2024-07-28 ENCOUNTER — Other Ambulatory Visit: Payer: Self-pay

## 2024-07-28 MED ORDER — OXYCONTIN 40 MG PO T12A
40.0000 mg | EXTENDED_RELEASE_TABLET | Freq: Two times a day (BID) | ORAL | 0 refills | Status: DC
  Filled 2024-07-28: qty 60, 30d supply, fill #0

## 2024-07-28 MED ORDER — OXYCODONE HCL 30 MG PO TABS
30.0000 mg | ORAL_TABLET | Freq: Three times a day (TID) | ORAL | 0 refills | Status: AC | PRN
  Filled 2024-07-28: qty 90, 30d supply, fill #0

## 2024-08-26 ENCOUNTER — Other Ambulatory Visit: Payer: Self-pay

## 2024-08-26 MED ORDER — OXYCODONE HCL 30 MG PO TABS
30.0000 mg | ORAL_TABLET | Freq: Three times a day (TID) | ORAL | 0 refills | Status: AC
  Filled 2024-08-26: qty 90, 30d supply, fill #0

## 2024-08-26 MED ORDER — OXYCONTIN 40 MG PO T12A
40.0000 mg | EXTENDED_RELEASE_TABLET | Freq: Two times a day (BID) | ORAL | 0 refills | Status: AC
  Filled 2024-08-26 – 2024-08-27 (×3): qty 60, 30d supply, fill #0

## 2024-08-27 ENCOUNTER — Other Ambulatory Visit: Payer: Self-pay

## 2024-08-27 ENCOUNTER — Other Ambulatory Visit (HOSPITAL_BASED_OUTPATIENT_CLINIC_OR_DEPARTMENT_OTHER): Payer: Self-pay
